# Patient Record
Sex: Male | Born: 1945 | Race: White | Hispanic: No | Marital: Married | Smoking: Former smoker
Health system: Southern US, Community
[De-identification: ages and names within clinical notes are randomized; demographics above are authoritative.]

## PROBLEM LIST (undated history)

## (undated) DIAGNOSIS — R7303 Prediabetes: Secondary | ICD-10-CM

## (undated) DIAGNOSIS — M1990 Unspecified osteoarthritis, unspecified site: Secondary | ICD-10-CM

## (undated) DIAGNOSIS — K219 Gastro-esophageal reflux disease without esophagitis: Secondary | ICD-10-CM

## (undated) HISTORY — PX: JOINT REPLACEMENT: SHX530

## (undated) HISTORY — DX: Gastro-esophageal reflux disease without esophagitis: K21.9

---

## 2006-05-29 ENCOUNTER — Ambulatory Visit: Payer: Self-pay | Admitting: Family Medicine

## 2008-01-22 ENCOUNTER — Ambulatory Visit: Payer: Self-pay | Admitting: Gastroenterology

## 2008-10-19 ENCOUNTER — Ambulatory Visit: Payer: Self-pay | Admitting: Neurology

## 2009-09-19 ENCOUNTER — Inpatient Hospital Stay: Payer: Self-pay | Admitting: *Deleted

## 2012-01-17 ENCOUNTER — Other Ambulatory Visit: Payer: Self-pay | Admitting: *Deleted

## 2012-12-30 ENCOUNTER — Ambulatory Visit: Payer: Self-pay | Admitting: Orthopedic Surgery

## 2012-12-30 DIAGNOSIS — I10 Essential (primary) hypertension: Secondary | ICD-10-CM

## 2012-12-30 LAB — BASIC METABOLIC PANEL
Anion Gap: 2 — ABNORMAL LOW (ref 7–16)
Calcium, Total: 9.2 mg/dL (ref 8.5–10.1)
Chloride: 105 mmol/L (ref 98–107)
EGFR (African American): >60
Glucose: 113 mg/dL — ABNORMAL HIGH (ref 65–99)
Osmolality: 278 (ref 275–301)
Potassium: 4.2 mmol/L (ref 3.5–5.1)
Sodium: 137 mmol/L (ref 136–145)

## 2012-12-30 LAB — CBC
MCH: 31.5 pg (ref 26.0–34.0)
RDW: 13.6 % (ref 11.5–14.5)
WBC: 7.4 10*3/uL (ref 3.8–10.6)

## 2012-12-30 LAB — SEDIMENTATION RATE: Erythrocyte Sed Rate: 17 mm/hr (ref 0–20)

## 2012-12-30 LAB — APTT: Activated PTT: 31.1 secs (ref 23.6–35.9)

## 2012-12-30 LAB — MRSA PCR SCREENING

## 2013-01-05 ENCOUNTER — Inpatient Hospital Stay: Payer: Self-pay | Admitting: Orthopedic Surgery

## 2013-01-06 LAB — BASIC METABOLIC PANEL
Anion Gap: 4 — ABNORMAL LOW (ref 7–16)
Calcium, Total: 8.2 mg/dL — ABNORMAL LOW (ref 8.5–10.1)
Chloride: 99 mmol/L (ref 98–107)
Co2: 30 mmol/L (ref 21–32)
Creatinine: 0.7 mg/dL (ref 0.60–1.30)
EGFR (African American): >60
EGFR (Non-African Amer.): >60
Glucose: 123 mg/dL — ABNORMAL HIGH (ref 65–99)
Potassium: 3.8 mmol/L (ref 3.5–5.1)
Sodium: 133 mmol/L — ABNORMAL LOW (ref 136–145)

## 2013-01-06 LAB — HEMOGLOBIN: HGB: 12.9 g/dL — ABNORMAL LOW (ref 13.0–18.0)

## 2013-01-07 LAB — PATHOLOGY REPORT

## 2013-05-26 ENCOUNTER — Other Ambulatory Visit: Payer: Self-pay | Admitting: Medical Oncology

## 2013-11-08 ENCOUNTER — Ambulatory Visit: Payer: Self-pay | Admitting: Gastroenterology

## 2014-03-16 IMAGING — XA DG C-ARM 1-60 MIN
1 series · 1 of 1 positions shown · non-contrast
Comparison: none

REASON FOR EXAM: Fracture reduction/fixation
COMMENTS:

[Series 6001: 1 · 1 of 1 slices shown]
[im 1/1]
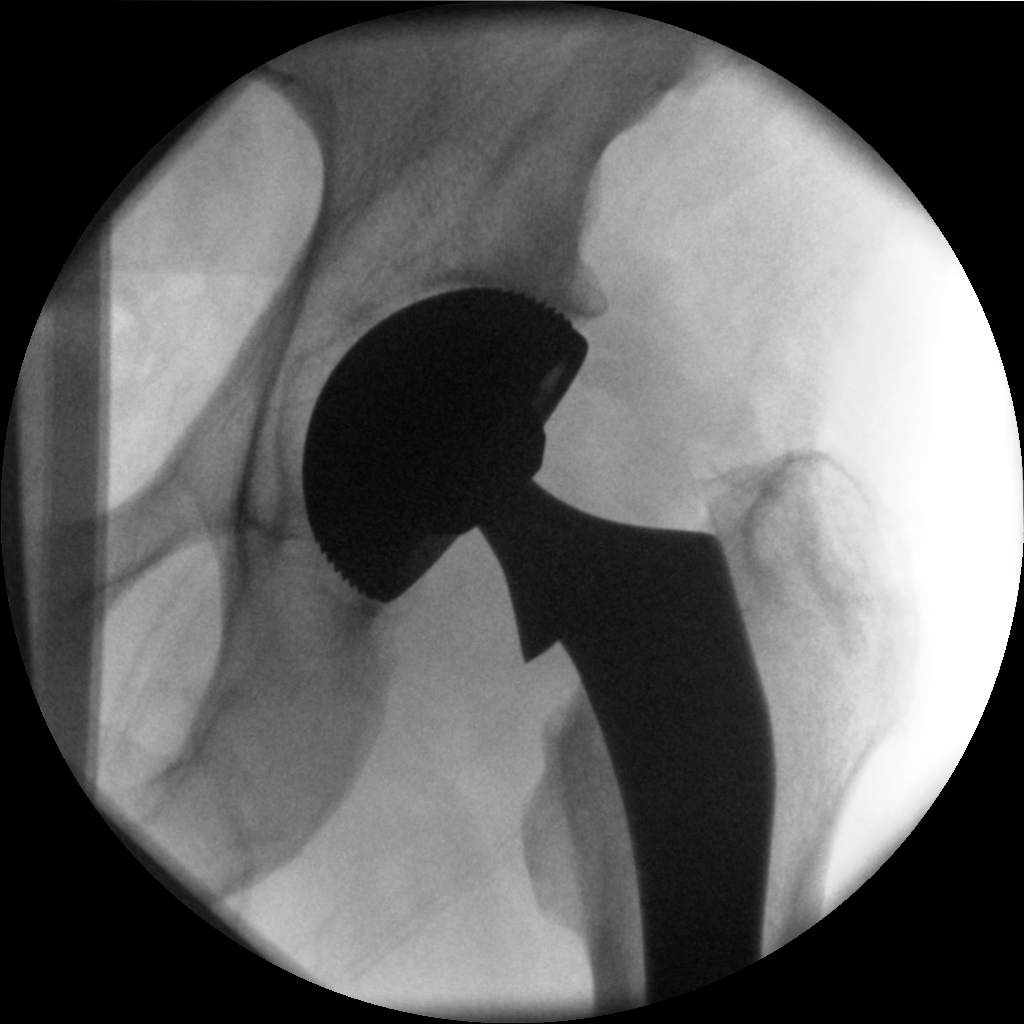

[1 of 1 positions shown; findings below may reference images not displayed]

PROCEDURE:     DXR - DXR C-ARM WITH 2 VIEWS LT HIP  - January 05, 2013 [DATE]

RESULT:     A single fluoroscopic spot film from the operating room reveals
the presence of a prosthetic hip joint presumably on the left. Radiographic
positioning of the visualized portions of the prosthetic components is good.
IMPRESSION: A prosthetic left hip joint is present. For further
interpretation is deferred to Dr. Jim.

[REDACTED]

## 2014-09-08 ENCOUNTER — Ambulatory Visit: Payer: Self-pay | Admitting: Orthopedic Surgery

## 2014-09-16 ENCOUNTER — Inpatient Hospital Stay: Payer: Self-pay | Admitting: Orthopedic Surgery

## 2014-11-18 NOTE — Op Note (Signed)
PATIENT NAME:  Travis CoyOVERBY, Oreste MR#:  [id number] DATE OF BIRTH:  22-Nov-1945  DATE OF PROCEDURE:  01/05/2013  PREOPERATIVE DIAGNOSIS: Left hip osteoarthritis.   POSTOPERATIVE DIAGNOSIS: Left hip osteoarthritis.   PROCEDURE: Left total hip replacement, anterior approach.   SURGEON: Kennedy BuckerMichael Kimberli Winne, M.D.   ASSISTANT:  Cranston Neighborhris Gaines, PA-C  ANESTHESIA: Spinal.   DESCRIPTION OF PROCEDURE: The patient was brought to the operating room and after adequate anesthesia was obtained, the patient was placed in a supine position with the right leg in a well leg holder and the left leg in the Medacta attachment. The C-arm was brought in and good visualization and x-ray of the left hip was obtained and printed. The hip was then prepped and draped using the usual method. After patient identification and timeout procedures were completed, an 8 cm incision was made over the anterior lateral hip overlying the tensor fascia lata muscle belly. The fascia was incised and the muscle belly retracted laterally with hemostasis being achieved with electrocautery. The deep fascia was incised and the rectus sheath opened. The circumflex vessels were identified, clamped and cauterized and the anterior capsule exposed, with anterior capsule tissue removed. Next, capsulotomy was carried out with based flap modified. Retractors were placed and this point and the femoral neck cut carried out. The head was removed and noted to have significant osteoarthritis. Modified Charnley was then placed and the labrum was excised along with the ligamentum teres. Sequential reaming was carried out up to 56 mm and a 56 mm trial fit well, medializing the cup to prevent lateral impingement of cup. The trial fit well and so the 56 mm dual mobility cup was impacted into position. At this point, the leg was externally rotated and a posterior capsular release of the pubofemoral and  ischiofemoral ligaments carried out. The leg was dropped into extension and  adduction, giving good exposure to the proximal femur. A box osteotome was used followed by a curette and the canal finder. Sequential broaching was carried out with trialing with a size 4. This was somewhat short and the canal did not appear  to be completely filled, so going up to a size 5 stem. Trials were carried out with a short femoral head. The leg lengths appeared equal on x-ray to the preop x-ray.  The #5 standard stem was then impacted down the canal with the collar.  The size S head, 28 mm, was placed inside the Versafit DM liner and applied to the size 5 stem, the head impacted and the hip reduced. The hip appeared stable was stable at 90 degrees rotation. Leg lengths appeared equal. X-rays taken at this time, printed off and compared to preop. The wound was irrigated. The capsule repaired using a single Ethibond suture, running quill for the deep fascia, 2-0 Vicryl subcutaneously and skin staples with 0.5% Sensorcaine infiltrated in the area of the incision to aid in postoperative analgesia.   ESTIMATED BLOOD LOSS: 150 mL.   COMPLICATIONS: None.   SPECIMENS: Femoral head.   FINDINGS: Extensive degenerative changes to the head and acetabulum.   IMPLANTS: Medacta Versafit cup DM 6 mm with a 56 mm DM liner. Size S 28 mm femoral head and a 5 standard Amis stem collared.  CONDITION TO RECOVERY ROOM: Stable    ____________________________ Leitha SchullerMichael J. Carmalita Wakefield, MD mjm:cc D: 01/05/2013 20:15:07 ET T: 01/05/2013 22:34:51 ET JOB#: [id number]  cc: Leitha SchullerMichael J. Robi Mitter, MD, <Dictator> Leitha SchullerMICHAEL J Julian Askin MD ELECTRONICALLY SIGNED 01/06/2013 7:57

## 2014-11-18 NOTE — Discharge Summary (Signed)
PATIENT NAME:  Travis Hodges, Bilbo MR#:  [id number] DATE OF BIRTH:  Mar 01, 1946  DATE OF ADMISSION:  01/05/2013 DATE OF DISCHARGE:  01/08/2013   ADMITTING DIAGNOSIS: Left hip osteoarthritis.   DISCHARGE DIAGNOSIS: Left hip osteoarthritis.   OPERATION: On 01/05/2013, he had a left total hip arthroplasty, anterior approach.   SURGEON: Leitha SchullerMichael J. Menz, MD  ANESTHESIA: Spinal.   ESTIMATED BLOOD LOSS: 150 mL.   DRAINS: None.  IMPLANTS: Medacta 56 DM cup and liner, S 28 mm head, AMIS 5 collared stem.   COMPLICATIONS: None.   HISTORY: Travis Hodges is a 69 year old male who failed conservative treatments for left hip osteoarthritis. He consented to surgery.   PHYSICAL EXAMINATION:  LUNGS: Clear.  HEART: Regular rate and rhythm.  HEENT: Normal.  MUSCULOSKELETAL: Has 0 degrees of internal rotation. Has 20 degrees of external rotation with severe pain. He has no clubbing or cyanosis or edema. He had strong DP and PT pulses. Slight flexion contracture.   HOSPITAL COURSE: After initial admission on 01/05/2013, he underwent a left total hip arthroplasty on that same day. He had good pain control afterwards and was brought to the orthopedic floor from the PACU. He had an initial hemoglobin of 12.9 and platelets 186. Physical therapy was begun on that day, but he did have some nausea with therapy. On postoperative day two, 01/06/2013, he continued to progress with therapy, although was having some pain. On postoperative day three, 01/08/2013, he continued to progress with physical therapy and was stable and ready to go home.   CONDITION AT DISCHARGE: Stable.   DISPOSITION: The patient was sent home.   DISCHARGE INSTRUCTIONS:  1. The patient will [facility] in 2 weeks for staple removal.  2. He will do home health physical therapy and be weight-bearing as tolerated on the left lower extremity.  3. TED hose knee-high bilaterally.  4. Diet is regular.  5. Aquacel dressing  was placed today and does not need to be changed until he comes back to clinic.   DISCHARGE MEDICATIONS:  1. Trazodone 100 mg half-tablet p.o. q.h.s.   2. Norco 325/5 one tab p.o. q.6 hours p.r.n. pain.  3. Prevacid 30 mg delayed release 1 cap p.o. q.a.m.  4. Acetaminophen 500 mg 1 tab p.o. q.4 hours p.r.n. pain or temperature greater than 100.4.  5. Oxycodone 5 mg 1 to 2 tabs p.o. q.4-6 hours p.r.n. severe pain.  6. Tramadol 50 mg 1 tab p.o. q.6 hours p.r.n. pain.  7. Magnesium hydroxide 8% oral suspension 30 mL 2 times daily as needed for constipation. 8. Aluminum hydroxide/magnesium hydroxide/simethicone 400/400/40 per 5 mL oral suspension 30 mL p.o. q.6 p.r.n. indigestion or heartburn.  9. Rivaroxaban 10 mg 1 tab p.o. q.a.m. for 33 days.  10. Ondansetron 4 mg 1 tab p.o. q.4 hours p.r.n. nausea.  11. Diazepam 5 mg 1 tab p.o. q.8 hours p.r.n. anxiety.  12. Zolpidem 5 mg 1 tab p.o. q.h.s. p.r.n. insomnia.  13. Bisacodyl 10 mg rectal suppository 1 suppository rectal once daily p.r.n. constipation.  14. Docusate/senna 50/8.6 one tab p.o. b.i.d. p.r.n. constipation.   ____________________________ Tahiri Shareef M. Haskel KhanBerndt, NP amb:OSi D: 01/08/2013 08:53:26 ET T: 01/08/2013 09:24:49 ET JOB#: [id number]  cc: Taner Rzepka M. Haskel KhanBerndt, NP, <Dictator> Burt EkAPRIL M Obbie Lewallen FNP ELECTRONICALLY SIGNED 02/02/2013 13:03

## 2014-11-21 LAB — SURGICAL PATHOLOGY

## 2014-11-27 NOTE — Op Note (Signed)
PATIENT NAME:  Travis Hodges, Travis Hodges MR#:  [id number] DATE OF BIRTH:  1945/08/12  DATE OF PROCEDURE:  09/16/2014  PREOPERATIVE DIAGNOSIS: Right hip osteoarthritis.   POSTOPERATIVE DIAGNOSIS: Right hip osteoarthritis.  PROCEDURE: Right hip right total hip replacement.   ANESTHESIA: Spinal.   SURGEON: Kennedy BuckerMichael Greysyn Vanderberg, MD   DESCRIPTION OF PROCEDURE: The patient was brought to the operating room and after adequate anesthesia was obtained, the patient was placed on the operative table with the right foot in the Medacta attachment, left foot on a well-padded table. The C-arm was brought in and good visualization of the hip with preoperative template obtained. The hip was then prepped and draped in the usual sterile manner with appropriate patient identification and timeout procedure completed. A direct anterior approach was made over the greater trochanter and TFL muscle. The incision was carried down through the skin and subcutaneous tissue. The tensor fascia was incised and retracted laterally. Deep fascia incised and the lateral femoral vessels ligated. The anterior capsule was then exposed and a capsulotomy performed. The neck was then exposed and the femoral head and neck cut carried out with the head removed showing extensive degenerative change. Reaming was carried out to 58 mm, which at which point there was good bleeding bone in the acetabulum. A 58 mm Versafitcup fit well after initially using a trial. The leg was externally rotated and minimal release of the ischiofemoral ligament was carried out. The leg was dropped into extension. Sequential broaching was carried out to a size 5 with an M trial. This gave appropriate leg length and offset. The size 5 stem was impacted with the M 28 head combined with a dual mobility liner assembled on the back table. These were all inserted. It was reduced after checking that that the acetabulum was free of any debris. The hip was stable to 90 degrees external rotation  test. The wound was irrigated with Betadine solution. Prior to wound closure, infiltration of 30 mL of 0.25% Sensorcaine with epinephrine. The deep fascia was repaired using a heavy quill, 2-0 quill subcutaneously, and skin staples. Xeroform, 4 x 4's, ABD, and tape applied. The patient was sent to recovery room stable condition.   ESTIMATED BLOOD LOSS: 300 mL.   COMPLICATIONS: None.   SPECIMEN: Removed femoral head.   IMPLANTS: Medacta Versafitcup DM 58 mm with liner, M 28 mm head, and a size 5 standard Amis stem.    ____________________________ Leitha SchullerMichael J. Myrtie Leuthold, MD mjm:bm Hodges: 09/16/2014 19:11:20 ET T: 09/17/2014 06:17:53 ET JOB#: [id number]  cc: Leitha SchullerMichael J. Tishara Pizano, MD, <Dictator> Leitha SchullerMICHAEL J Brent Noto MD ELECTRONICALLY SIGNED 09/17/2014 7:26

## 2014-11-27 NOTE — Discharge Summary (Signed)
PATIENT NAME:  Travis Hodges, Jevaun D MR#:  [id number] DATE OF BIRTH:  07-05-1946  DATE OF ADMISSION:  09/16/2014 DATE OF DISCHARGE: 09/19/2014   ADMITTING DIAGNOSIS: Right hip osteoarthritis.   DISCHARGE DIAGNOSIS: Right hip osteoarthritis.  OPERATION: On 09/16/2014, the patient had a right hip replacement using anterior hip.   ANESTHESIA: Spinal.   ESTIMATED BLOOD LOSS: 300 mL.   COMPLICATIONS: None.   IMPLANTS USED: A Medacta Versafitcup diastolic murmur 58 mm with liner, M 28 mm head, size 5 standard AMIS stem. The patient was stabilized, brought to the recovery room and then brought down to the orthopedic floor.   HISTORY: The patient is a 69 year old male who presented for persistent pain involving the right hip and leg. The patient has been refractory to conservative treatment and has tried physical therapy as well as cortisone injections with no relief.   PHYSICAL EXAMINATION:  GENERAL: Well-developed, well-nourished male with an abnormal gait and a moderate limp on the right side. The patient has range of motion of the right hip with 60% of motion including severe pain. The patient has full extension to 120 degrees flexion with pain. The patient has internal and external rotation but is limited.   HOSPITAL COURSE: After initial admission on 09/16/2014, the patient was brought to the orthopedic floor. On postoperative day 1, the patient had a hemoglobin of 13.1, which remained stable there. The patient did physical therapy initially ambulating bed to chair and then progressively ambulating around the nurse's station, including doing 4 stairs. The patient did have bowel movement and was ready to go home with home health physical therapy on 09/19/2014.    CONDITION AT DISCHARGE: Stable.   DISPOSITION: The patient was sent home with home health physical therapy. The patient will follow up [facility] in 2 weeks for staple removal. The patient will do weight bearing as tolerated on the  affected leg. The patient will use 1-2 pillows under the knee and foot. The patient will use anterior hip precautions. The patient will use knee-high TED hose on both legs, removed at nighttime. The patient will elevate his heels off the bed and use incentive spirometer as well as be encouraged to do cough and deep breathing. The patient will use a regular diet. The patient will use an ice pack on the leg if needed. The patient will keep his dressing clean and dry, try not to get it wet or dirty. The patient will do dressing changes as needed with physical therapy. The patient will call the clinic if there is any bright red bleeding, calf pain, bowel or bladder difficulty, or any fever greater than 101.5. The patient will do home health physical therapy working on gait training and strengthening.    DISCHARGE MEDICATIONS: No change in home medications and just add oxycodone 5 mg 1 tablet every 4 hours as needed for moderate to severe pain, Tylenol 500 mg 1 tablet every 4 hours as needed for less severe pain or fever, Lovenox 40 mg subcutaneous once a day for 14 days and then discontinue and start an aspirin 81 mg once a day and Flexeril 10 mg 1 tablet q. 8 hours as needed for muscle spasms.    ___________________________ Shela CommonsJ. Dedra Skeensodd Collene Massimino, [place]PA jtm:mc D: 09/19/2014 06:24:00 ET T: 09/19/2014 08:08:18 ET JOB#: [id number]  cc: J. Dedra Skeensodd Gorje Iyer, [place]PA, <Dictator> Jim DesanctisJ Lennis Korb [facility] PA ELECTRONICALLY SIGNED 09/20/2014 13:39

## 2015-11-21 ENCOUNTER — Emergency Department
Admission: EM | Admit: 2015-11-21 | Discharge: 2015-11-22 | Disposition: A | Discharge status: Home / Self Care | Payer: Managed Care, Other (non HMO) | Attending: Emergency Medicine | Admitting: Emergency Medicine

## 2015-11-21 ENCOUNTER — Emergency Department: Payer: Managed Care, Other (non HMO)

## 2015-11-21 ENCOUNTER — Encounter: Payer: Self-pay | Admitting: *Deleted

## 2015-11-21 DIAGNOSIS — R112 Nausea with vomiting, unspecified: Secondary | ICD-10-CM | POA: Insufficient documentation

## 2015-11-21 DIAGNOSIS — R079 Chest pain, unspecified: Secondary | ICD-10-CM | POA: Diagnosis present

## 2015-11-21 DIAGNOSIS — A0811 Acute gastroenteropathy due to Norwalk agent: Secondary | ICD-10-CM

## 2015-11-21 DIAGNOSIS — R109 Unspecified abdominal pain: Secondary | ICD-10-CM | POA: Insufficient documentation

## 2015-11-21 DIAGNOSIS — R197 Diarrhea, unspecified: Secondary | ICD-10-CM | POA: Insufficient documentation

## 2015-11-21 LAB — BASIC METABOLIC PANEL
Anion gap: 9 (ref 5–15)
BUN: 27 mg/dL — AB (ref 6–20)
CHLORIDE: 104 mmol/L (ref 101–111)
CO2: 24 mmol/L (ref 22–32)
CREATININE: 0.91 mg/dL (ref 0.61–1.24)
Calcium: 9.2 mg/dL (ref 8.9–10.3)
GFR calc Af Amer: >60 mL/min (ref >60)
GFR calc non Af Amer: >60 mL/min (ref >60)
GLUCOSE: 128 mg/dL — AB (ref 65–99)
POTASSIUM: 4.4 mmol/L (ref 3.5–5.1)
SODIUM: 137 mmol/L (ref 135–145)

## 2015-11-21 LAB — CBC
HEMATOCRIT: 46.5 % (ref 40.0–52.0)
Hemoglobin: 16.3 g/dL (ref 13.0–18.0)
MCH: 30.9 pg (ref 26.0–34.0)
MCHC: 35.1 g/dL (ref 32.0–36.0)
MCV: 88 fL (ref 80.0–100.0)
Platelets: 205 10*3/uL (ref 150–440)
RBC: 5.29 MIL/uL (ref 4.40–5.90)
RDW: 13.6 % (ref 11.5–14.5)
WBC: 11.1 10*3/uL — AB (ref 3.8–10.6)

## 2015-11-21 LAB — TROPONIN I
Troponin I: <0.03 ng/mL (ref <0.031)
Troponin I: <0.03 ng/mL (ref <0.031)

## 2015-11-21 MED ORDER — DICYCLOMINE HCL 20 MG PO TABS: 20.0000 mg | ORAL_TABLET | Freq: Three times a day (TID) | ORAL | Status: DC | PRN

## 2015-11-21 MED ORDER — ONDANSETRON HCL 4 MG/2ML IJ SOLN
4.0000 mg | Freq: Once | INTRAMUSCULAR | Status: AC
  Administered 2015-11-21: 4 mg via INTRAVENOUS
  Filled 2015-11-21: qty 2

## 2015-11-21 MED ORDER — LOPERAMIDE HCL 2 MG PO TABS: 2.0000 mg | ORAL_TABLET | Freq: Four times a day (QID) | ORAL | Status: DC | PRN

## 2015-11-21 MED ORDER — ONDANSETRON HCL 4 MG PO TABS: 4.0000 mg | ORAL_TABLET | Freq: Every day | ORAL | Status: DC | PRN

## 2015-11-21 MED ORDER — DIATRIZOATE MEGLUMINE & SODIUM 66-10 % PO SOLN
15.0000 mL | Freq: Once | ORAL | Status: AC
  Administered 2015-11-21: 15 mL via ORAL

## 2015-11-21 MED ORDER — MORPHINE SULFATE (PF) 4 MG/ML IV SOLN
4.0000 mg | Freq: Once | INTRAVENOUS | Status: AC
  Administered 2015-11-21: 4 mg via INTRAVENOUS
  Filled 2015-11-21: qty 1

## 2015-11-21 MED ORDER — SODIUM CHLORIDE 0.9 % IV SOLN
Freq: Once | INTRAVENOUS | Status: AC
  Administered 2015-11-21: 22:00:00 via INTRAVENOUS

## 2015-11-21 MED ORDER — IOPAMIDOL (ISOVUE-300) INJECTION 61%
100.0000 mL | Freq: Once | INTRAVENOUS | Status: AC | PRN
Start: 2015-11-21 — End: 2015-11-21
  Administered 2015-11-21: 100 mL via INTRAVENOUS

## 2015-11-21 NOTE — ED Notes (Signed)
Patient transported to CT 

## 2015-11-21 NOTE — ED Notes (Signed)
Pt to ED via POV with left sided chest pain, SOB, lightheadedness and dizziness that began about 1-2 hours ago while at work. Pt was sitting at desk when s/s began. Pt clenching chest upon arrival, states pain 10/10 in chest and abd.

## 2015-11-21 NOTE — ED Provider Notes (Signed)
[facility] Emergency Department Provider Note     Time seen: ----------------------------------------- 9:55 PM on 11/21/2015 -----------------------------------------    I have reviewed the triage vital signs and the nursing notes.   HISTORY  Chief Complaint Chest Pain and Shortness of Breath    HPI Travis Hodges is a 70 y.o. male who presents ER for left-sided chest pain, shortness of breath and lightheadedness that began about 2 hours ago while at work. Patient states he's had significant nausea vomiting and diarrhea and he is passing blood in his stool. Patient describes pain in the upper part of his abdomen as severe and sharp. He is still feeling nauseated.   History reviewed. No pertinent past medical history.  There are no active problems to display for this patient.   Past Surgical History  Procedure Laterality Date  . Joint replacement      Allergies Review of patient's allergies indicates no known allergies.  Social History Social History  Substance Use Topics  . Smoking status: Never Smoker   . Smokeless tobacco: None  . Alcohol Use: No    Review of Systems Constitutional: Negative for fever. Eyes: Negative for visual changes. ENT: Negative for sore throat. Cardiovascular: Positive for chest pain Respiratory: Positive for difficulty breathing Gastrointestinal: Positive for abdominal pain, vomiting and diarrhea with some blood in the stool Genitourinary: Negative for dysuria. Musculoskeletal: Negative for back pain. Skin: Negative for rash. Neurological: Positive for weakness  10-point ROS otherwise negative.  ____________________________________________   PHYSICAL EXAM:  VITAL SIGNS: ED Triage Vitals  Enc Vitals Group     BP 11/21/15 1951 167/85 mmHg     Pulse Rate 11/21/15 1951 82     Resp 11/21/15 1951 18     Temp 11/21/15 1951 98.3 F (36.8 C)     Temp Source 11/21/15 1951 Oral     SpO2 11/21/15 1951 99  %     Weight 11/21/15 1951 210 lb (95.255 kg)     Height 11/21/15 1951 5\' 10"  (1.778 m)     Head Cir --      Peak Flow --      Pain Score 11/21/15 1952 10     Pain Loc --      Pain Edu? --      Excl. in GC? --    Constitutional: Alert and oriented. Mild distress Eyes: Conjunctivae are normal. PERRL. Normal extraocular movements. ENT   Head: Normocephalic and atraumatic.   Nose: No congestion/rhinnorhea.   Mouth/Throat: Mucous membranes are moist.   Neck: No stridor. Cardiovascular: Normal rate, regular rhythm. No murmurs, rubs, or gallops. Respiratory: Normal respiratory effort without tachypnea nor retractions. Breath sounds are clear and equal bilaterally. No wheezes/rales/rhonchi. Gastrointestinal: Moderate to severe upper abdominal tenderness, distended abdomen, normal bowel sounds. Musculoskeletal: Nontender with normal range of motion in all extremities. No lower extremity tenderness nor edema. Neurologic:  Normal speech and language. No gross focal neurologic deficits are appreciated.  Skin:  Skin is warm, dry and intact. No rash noted. Psychiatric: Mood and affect are normal. Speech and behavior are normal.  ____________________________________________  EKG: Interpreted by me. Sinus rhythm with sinus arrhythmia, rate is 81 bpm, normal PR interval, normal QRS, normal QT intervals. Normal axis.  ____________________________________________  ED COURSE:  Pertinent labs & imaging results that were available during my care of the patient were reviewed by me and considered in my medical decision making (see chart for details). Patient's primary complaint is abdominal pain with vomiting and diarrhea.  I will check basic labs, give IV fluids and antiemetics. Patient may also need imaging of the upper abdomen. ____________________________________________    LABS (pertinent positives/negatives)  Labs Reviewed  BASIC METABOLIC PANEL - Abnormal; Notable for the  following:    Glucose, Bld 128 (*)    BUN 27 (*)    All other components within normal limits  CBC - Abnormal; Notable for the following:    WBC 11.1 (*)    All other components within normal limits  TROPONIN I    RADIOLOGY Chest x-ray IMPRESSION: Peribronchial thickening noted. Lungs otherwise grossly clear. CT of the abdomen and pelvis with contrast: IMPRESSION: 1. Numerous distended and fluid-filled small bowel loops. No transition point is seen, suggesting enteritis rather than partial obstruction 2. Cholelithiasis without cholecystitis. 3. Colonic diverticulosis. ____________________________________________  FINAL ASSESSMENT AND PLAN  Abdominal pain, vomiting and diarrhea, chest pain  Plan: Patient with labs and imaging as dictated above. Patient clinically with gastroenteritis, no bowel obstruction seen on CT. He'll be discharged with antiemetics and antidiarrheal agents. He is stable for outpatient follow-up with his doctor, troponin was negative 2.   Emily Filbert, MD   Emily Filbert, MD 11/21/15 [phone]

## 2015-11-21 NOTE — Discharge Instructions (Signed)
Nonspecific Chest Pain  Chest pain can be caused by many different conditions. There is always a chance that your pain could be related to something serious, such as a heart attack or a blood clot in your lungs. Chest pain can also be caused by conditions that are not life-threatening. If you have chest pain, it is very important to follow up with your health care provider. CAUSES  Chest pain can be caused by:  Heartburn.  Pneumonia or bronchitis.  Anxiety or stress.  Inflammation around your heart (pericarditis) or lung (pleuritis or pleurisy).  A blood clot in your lung.  A collapsed lung (pneumothorax). It can develop suddenly on its own (spontaneous pneumothorax) or from trauma to the chest.  Shingles infection (varicella-zoster virus).  Heart attack.  Damage to the bones, muscles, and cartilage that make up your chest wall. This can include:  Bruised bones due to injury.  Strained muscles or cartilage due to frequent or repeated coughing or overwork.  Fracture to one or more ribs.  Sore cartilage due to inflammation (costochondritis). RISK FACTORS  Risk factors for chest pain may include:  Activities that increase your risk for trauma or injury to your chest.  Respiratory infections or conditions that cause frequent coughing.  Medical conditions or overeating that can cause heartburn.  Heart disease or family history of heart disease.  Conditions or health behaviors that increase your risk of developing a blood clot.  Having had chicken pox (varicella zoster). SIGNS AND SYMPTOMS Chest pain can feel like:  Burning or tingling on the surface of your chest or deep in your chest.  Crushing, pressure, aching, or squeezing pain.  Dull or sharp pain that is worse when you move, cough, or take a deep breath.  Pain that is also felt in your back, neck, shoulder, or arm, or pain that spreads to any of these areas. Your chest pain may come and go, or it may stay  constant. DIAGNOSIS Lab tests or other studies may be needed to find the cause of your pain. Your health care provider may have you take a test called an ambulatory ECG (electrocardiogram). An ECG records your heartbeat patterns at the time the test is performed. You may also have other tests, such as:  Transthoracic echocardiogram (TTE). During echocardiography, sound waves are used to create a picture of all of the heart structures and to look at how blood flows through your heart.  Transesophageal echocardiogram (TEE).This is a more advanced imaging test that obtains images from inside your body. It allows your health care provider to see your heart in finer detail.  Cardiac monitoring. This allows your health care provider to monitor your heart rate and rhythm in real time.  Holter monitor. This is a portable device that records your heartbeat and can help to diagnose abnormal heartbeats. It allows your health care provider to track your heart activity for several days, if needed.  Stress tests. These can be done through exercise or by taking medicine that makes your heart beat more quickly.  Blood tests.  Imaging tests. TREATMENT  Your treatment depends on what is causing your chest pain. Treatment may include:  Medicines. These may include:  Acid blockers for heartburn.  Anti-inflammatory medicine.  Pain medicine for inflammatory conditions.  Antibiotic medicine, if an infection is present.  Medicines to dissolve blood clots.  Medicines to treat coronary artery disease.  Supportive care for conditions that do not require medicines. This may include:  Resting.  Applying heat  or cold packs to injured areas.  Limiting activities until pain decreases. HOME CARE INSTRUCTIONS  If you were prescribed an antibiotic medicine, finish it all even if you start to feel better.  Avoid any activities that bring on chest pain.  Do not use any tobacco products, including  cigarettes, chewing tobacco, or electronic cigarettes. If you need help quitting, ask your health care provider.  Do not drink alcohol.  Take medicines only as directed by your health care provider.  Keep all follow-up visits as directed by your health care provider. This is important. This includes any further testing if your chest pain does not go away.  If heartburn is the cause for your chest pain, you may be told to keep your head raised (elevated) while sleeping. This reduces the chance that acid will go from your stomach into your esophagus.  Make lifestyle changes as directed by your health care provider. These may include:  Getting regular exercise. Ask your health care provider to suggest some activities that are safe for you.  Eating a heart-healthy diet. A registered dietitian can help you to learn healthy eating options.  Maintaining a healthy weight.  Managing diabetes, if necessary.  Reducing stress. SEEK MEDICAL CARE IF:  Your chest pain does not go away after treatment.  You have a rash with blisters on your chest.  You have a fever. SEEK IMMEDIATE MEDICAL CARE IF:   Your chest pain is worse.  You have an increasing cough, or you cough up blood.  You have severe abdominal pain.  You have severe weakness.  You faint.  You have chills.  You have sudden, unexplained chest discomfort.  You have sudden, unexplained discomfort in your arms, back, neck, or jaw.  You have shortness of breath at any time.  You suddenly start to sweat, or your skin gets clammy.  You feel nauseous or you vomit.  You suddenly feel light-headed or dizzy.  Your heart begins to beat quickly, or it feels like it is skipping beats. These symptoms may represent a serious problem that is an emergency. Do not wait to see if the symptoms will go away. Get medical help right away. Call your local emergency services (911 in the [place] Do not drive yourself to the hospital.   This  information is not intended to replace advice given to you by your health care provider. Make sure you discuss any questions you have with your health care provider.   Document Released: 04/24/2005 Document Revised: 08/05/2014 Document Reviewed: 02/18/2014 Elsevier Interactive Patient Education 2016 [employer] Inc[employer]  Viral Gastroenteritis Viral gastroenteritis is also known as stomach flu. This condition affects the stomach and intestinal tract. It can cause sudden diarrhea and vomiting. The illness typically lasts 3 to 8 days. Most people develop an immune response that eventually gets rid of the virus. While this natural response develops, the virus can make you quite ill. CAUSES  Many different viruses can cause gastroenteritis, such as rotavirus or noroviruses. You can catch one of these viruses by consuming contaminated food or water. You may also catch a virus by sharing utensils or other personal items with an infected person or by touching a contaminated surface. SYMPTOMS  The most common symptoms are diarrhea and vomiting. These problems can cause a severe loss of body fluids (dehydration) and a body salt (electrolyte) imbalance. Other symptoms may include:  Fever.  Headache.  Fatigue.  Abdominal pain. DIAGNOSIS  Your caregiver can usually diagnose viral gastroenteritis based on your  symptoms and a physical exam. A stool sample may also be taken to test for the presence of viruses or other infections. TREATMENT  This illness typically goes away on its own. Treatments are aimed at rehydration. The most serious cases of viral gastroenteritis involve vomiting so severely that you are not able to keep fluids down. In these cases, fluids must be given through an intravenous line (IV). HOME CARE INSTRUCTIONS   Drink enough fluids to keep your urine clear or pale yellow. Drink small amounts of fluids frequently and increase the amounts as tolerated.  Ask your caregiver for specific  rehydration instructions.  Avoid:  Foods high in sugar.  Alcohol.  Carbonated drinks.  Tobacco.  Juice.  Caffeine drinks.  Extremely hot or cold fluids.  Fatty, greasy foods.  Too much intake of anything at one time.  Dairy products until 24 to 48 hours after diarrhea stops.  You may consume probiotics. Probiotics are active cultures of beneficial bacteria. They may lessen the amount and number of diarrheal stools in adults. Probiotics can be found in yogurt with active cultures and in supplements.  Wash your hands well to avoid spreading the virus.  Only take over-the-counter or prescription medicines for pain, discomfort, or fever as directed by your caregiver. Do not give aspirin to children. Antidiarrheal medicines are not recommended.  Ask your caregiver if you should continue to take your regular prescribed and over-the-counter medicines.  Keep all follow-up appointments as directed by your caregiver. SEEK IMMEDIATE MEDICAL CARE IF:   You are unable to keep fluids down.  You do not urinate at least once every 6 to 8 hours.  You develop shortness of breath.  You notice blood in your stool or vomit. This may look like coffee grounds.  You have abdominal pain that increases or is concentrated in one small area (localized).  You have persistent vomiting or diarrhea.  You have a fever.  The patient is a child younger than 3 months, and he or she has a fever.  The patient is a child older than 3 months, and he or she has a fever and persistent symptoms.  The patient is a child older than 3 months, and he or she has a fever and symptoms suddenly get worse.  The patient is a baby, and he or she has no tears when crying. MAKE SURE YOU:   Understand these instructions.  Will watch your condition.  Will get help right away if you are not doing well or get worse.   This information is not intended to replace advice given to you by your health care provider.  Make sure you discuss any questions you have with your health care provider.   Document Released: 07/15/2005 Document Revised: 10/07/2011 Document Reviewed: 05/01/2011 Elsevier Interactive Patient Education [employer]

## 2015-11-21 NOTE — ED Notes (Signed)
SpO2 88% on RA. Patient placed on 3L [place]. Tolerating well SpO2 95%.

## 2015-11-22 NOTE — ED Notes (Signed)
Discharge instructions reviewed with patient. Patient verbalized understanding. Patient taken to lobby via wheelchair and helped in to vehicle 

## 2016-11-28 ENCOUNTER — Ambulatory Visit: Payer: Self-pay | Admitting: Physician Assistant

## 2016-11-28 ENCOUNTER — Encounter: Payer: Self-pay | Admitting: Physician Assistant

## 2016-11-28 VITALS — BP 120/70 | HR 86 | Temp 98.5°F | Ht 72.0 in | Wt 217.0 lb

## 2016-11-28 DIAGNOSIS — Z0000 Encounter for general adult medical examination without abnormal findings: Secondary | ICD-10-CM

## 2016-11-28 DIAGNOSIS — R7309 Other abnormal glucose: Secondary | ICD-10-CM

## 2016-11-28 NOTE — Progress Notes (Signed)
S: pt here for wellness physical for insurance purposes, no complaints, states he is worried about becoming diabetic and his last glucose test was elevated, doesn't see his pcp until July;  ros neg. PMH: gerd   Social: nonsmoker, no etoh Fam: mother died of stomach cancer at 6292; father had dm  O: vitals wnl, nad, ENT wnl, neck supple no lymph, lungs c t a, cv rrr, abd soft nontender bs normal all 4 quads  A: wellness physical  P:  f/u with pcp for yearly physical in July, will order A1C today

## 2016-11-29 LAB — HGB A1C W/O EAG: Hgb A1c MFr Bld: 5.8 % — ABNORMAL HIGH (ref 4.8–5.6)

## 2017-05-19 ENCOUNTER — Other Ambulatory Visit: Payer: Self-pay | Admitting: Surgery

## 2017-05-19 DIAGNOSIS — M75101 Unspecified rotator cuff tear or rupture of right shoulder, not specified as traumatic: Secondary | ICD-10-CM

## 2017-05-19 DIAGNOSIS — M7581 Other shoulder lesions, right shoulder: Secondary | ICD-10-CM

## 2017-05-28 ENCOUNTER — Ambulatory Visit
Admission: RE | Admit: 2017-05-28 | Discharge: 2017-05-28 | Disposition: A | Discharge status: Home / Self Care | Payer: Medicare Other | Source: Ambulatory Visit | Attending: Surgery | Admitting: Surgery

## 2017-05-28 DIAGNOSIS — M19011 Primary osteoarthritis, right shoulder: Secondary | ICD-10-CM | POA: Diagnosis not present

## 2017-05-28 DIAGNOSIS — M75101 Unspecified rotator cuff tear or rupture of right shoulder, not specified as traumatic: Secondary | ICD-10-CM

## 2017-05-28 DIAGNOSIS — M7581 Other shoulder lesions, right shoulder: Secondary | ICD-10-CM

## 2017-06-24 ENCOUNTER — Inpatient Hospital Stay: Admission: RE | Admit: 2017-06-24 | Payer: Self-pay | Source: Ambulatory Visit

## 2017-06-25 ENCOUNTER — Other Ambulatory Visit: Payer: Self-pay

## 2017-06-25 ENCOUNTER — Encounter: Payer: Self-pay | Admitting: *Deleted

## 2017-06-25 ENCOUNTER — Encounter
Admission: RE | Admit: 2017-06-25 | Discharge: 2017-06-25 | Disposition: A | Discharge status: Home / Self Care | Payer: Medicare Other | Source: Ambulatory Visit | Attending: Surgery | Admitting: Surgery

## 2017-06-25 NOTE — Patient Instructions (Signed)
Your procedure is scheduled on: 07-01-17 TUESDAY Report to Same Day Surgery 2nd floor medical mall [facility](Medical Mall Entrance-take elevator on left to 2nd floor.  Check in with surgery information desk.) To find out your arrival time please call [phone] between 1PM - 3PM on 06-30-17 MONDAY  Remember: Instructions that are not followed completely may result in serious medical risk, up to and including death, or upon the discretion of your surgeon and anesthesiologist your surgery may need to be rescheduled.    _x___ 1. Do not eat food after midnight the night before your procedure.  NO GUM CHEWING OR CANDY AFTER MIDNIGHT.   You may drink clear liquids up to 2 hours before you are scheduled to arrive at the hospital for your procedure.  Do not drink clear liquids within 2 hours of your scheduled arrival to the hospital.  Clear liquids include  --Water or Apple juice without pulp  --Clear carbohydrate beverage such as ClearFast or Gatorade  --Black Coffee or Clear Tea (No milk, no creamers, do not add anything to the coffee or Tea     __x__ 2. No Alcohol for 24 hours before or after surgery.   __x__3. No Smoking for 24 prior to surgery.   ____  4. Bring all medications with you on the day of surgery if instructed.    __x__ 5. Notify your doctor if there is any change in your medical condition     (cold, fever, infections).     Do not wear jewelry, make-up, hairpins, clips or nail polish.  Do not wear lotions, powders, or perfumes. You may wear deodorant.  Do not shave 48 hours prior to surgery. Men may shave face and neck.  Do not bring valuables to the hospital.    [facility] is not responsible for any belongings or valuables.               Contacts, dentures or bridgework may not be worn into surgery.  Leave your suitcase in the car. After surgery it may be brought to your room.  For patients admitted to the hospital, discharge time is determined by your treatment  team.   Patients discharged the day of surgery will not be allowed to drive home.  You will need someone to drive you home and stay with you the night of your procedure.    Please read over the following fact sheets that you were given:   [facility] Preparing [facility] and or MRSA Information   _x___ TAKE THE FOLLOWING MEDICATION THE MORNING OF SURGERY WITH A SMALL SIP OF WATER. These include:  1. PREVACID  2. TAKE AN EXTRA PREVACID Monday NIGHT BEFORE BED  3.  4.  5.  6.  ____Fleets enema or Magnesium Citrate as directed.   _x___ Use CHG Soap or sage wipes as directed on instruction sheet   ____ Use inhalers on the day of surgery and bring to hospital day of surgery  ____ Stop Metformin and Janumet 2 days prior to surgery.    ____ Take 1/2 of usual insulin dose the night before surgery and none on the morning surgery.   ____ Follow recommendations from Cardiologist, Pulmonologist or PCP regarding stopping Aspirin, Coumadin, Plavix ,Eliquis, Effient, or Pradaxa, and Pletal.  X____Stop Anti-inflammatories such as Advil, Aleve, IBUPROFEN, Motrin, Naproxen,  Naprosyn, Goodies powders or aspirin products NOW- OK to take Tylenol OR TRAMADOL (ULTRAM) IF NEEDED   ____ Stop supplements until after surgery.     ____ [employer]  C-Pap to the hospital.

## 2017-06-27 ENCOUNTER — Encounter
Admission: RE | Admit: 2017-06-27 | Discharge: 2017-06-27 | Disposition: A | Discharge status: Home / Self Care | Payer: Medicare Other | Source: Ambulatory Visit | Attending: Surgery | Admitting: Surgery

## 2017-06-27 DIAGNOSIS — Z01810 Encounter for preprocedural cardiovascular examination: Secondary | ICD-10-CM | POA: Insufficient documentation

## 2017-06-27 DIAGNOSIS — M75101 Unspecified rotator cuff tear or rupture of right shoulder, not specified as traumatic: Secondary | ICD-10-CM | POA: Diagnosis not present

## 2017-06-27 NOTE — Pre-Procedure Instructions (Signed)
Pt here for EKG, went over pre-op instructions and surgical consent, pt voiced understanding and asked appropriate questions, both signed.

## 2017-06-30 MED ORDER — CEFAZOLIN SODIUM-DEXTROSE 2-4 GM/100ML-% IV SOLN
2.0000 g | Freq: Once | INTRAVENOUS | Status: AC
  Administered 2017-07-01: 2 g via INTRAVENOUS

## 2017-07-01 ENCOUNTER — Ambulatory Visit: Payer: Medicare Other | Admitting: Anesthesiology

## 2017-07-01 ENCOUNTER — Other Ambulatory Visit: Payer: Self-pay

## 2017-07-01 ENCOUNTER — Encounter: Payer: Self-pay | Admitting: *Deleted

## 2017-07-01 ENCOUNTER — Ambulatory Visit
Admission: RE | Admit: 2017-07-01 | Discharge: 2017-07-01 | Disposition: A | Discharge status: Home / Self Care | Payer: Medicare Other | Source: Ambulatory Visit | Attending: Surgery | Admitting: Surgery

## 2017-07-01 ENCOUNTER — Encounter
Admission: RE | Disposition: A | Discharge status: Home / Self Care | Payer: Self-pay | Source: Ambulatory Visit | Attending: Surgery

## 2017-07-01 DIAGNOSIS — M1990 Unspecified osteoarthritis, unspecified site: Secondary | ICD-10-CM | POA: Insufficient documentation

## 2017-07-01 DIAGNOSIS — Z96643 Presence of artificial hip joint, bilateral: Secondary | ICD-10-CM | POA: Insufficient documentation

## 2017-07-01 DIAGNOSIS — Z833 Family history of diabetes mellitus: Secondary | ICD-10-CM | POA: Diagnosis not present

## 2017-07-01 DIAGNOSIS — M67813 Other specified disorders of tendon, right shoulder: Secondary | ICD-10-CM | POA: Insufficient documentation

## 2017-07-01 DIAGNOSIS — K219 Gastro-esophageal reflux disease without esophagitis: Secondary | ICD-10-CM | POA: Diagnosis not present

## 2017-07-01 DIAGNOSIS — R7303 Prediabetes: Secondary | ICD-10-CM | POA: Diagnosis not present

## 2017-07-01 DIAGNOSIS — Z800 Family history of malignant neoplasm of digestive organs: Secondary | ICD-10-CM | POA: Insufficient documentation

## 2017-07-01 DIAGNOSIS — Z79899 Other long term (current) drug therapy: Secondary | ICD-10-CM | POA: Insufficient documentation

## 2017-07-01 DIAGNOSIS — M75101 Unspecified rotator cuff tear or rupture of right shoulder, not specified as traumatic: Secondary | ICD-10-CM | POA: Diagnosis not present

## 2017-07-01 DIAGNOSIS — Z87891 Personal history of nicotine dependence: Secondary | ICD-10-CM | POA: Insufficient documentation

## 2017-07-01 DIAGNOSIS — Z885 Allergy status to narcotic agent status: Secondary | ICD-10-CM | POA: Insufficient documentation

## 2017-07-01 DIAGNOSIS — M7541 Impingement syndrome of right shoulder: Secondary | ICD-10-CM | POA: Insufficient documentation

## 2017-07-01 HISTORY — PX: SHOULDER ARTHROSCOPY WITH DEBRIDEMENT AND BICEP TENDON REPAIR: SHX5690

## 2017-07-01 HISTORY — DX: Unspecified osteoarthritis, unspecified site: M19.90

## 2017-07-01 HISTORY — DX: Prediabetes: R73.03

## 2017-07-01 LAB — GLUCOSE, CAPILLARY: Glucose-Capillary: 100 mg/dL — ABNORMAL HIGH (ref 65–99)

## 2017-07-01 SURGERY — SHOULDER ARTHROSCOPY WITH DEBRIDEMENT AND BICEP TENDON REPAIR
Anesthesia: General | Laterality: Right | Wound class: Clean

## 2017-07-01 MED ORDER — MIDAZOLAM HCL 2 MG/2ML IJ SOLN
INTRAMUSCULAR | Status: AC
  Administered 2017-07-01: 1 mg via INTRAVENOUS
  Filled 2017-07-01: qty 2

## 2017-07-01 MED ORDER — BUPIVACAINE-EPINEPHRINE (PF) 0.5% -1:200000 IJ SOLN
INTRAMUSCULAR | Status: AC
  Filled 2017-07-01: qty 30

## 2017-07-01 MED ORDER — MIDAZOLAM HCL 2 MG/2ML IJ SOLN
1.0000 mg | Freq: Once | INTRAMUSCULAR | Status: AC
  Administered 2017-07-01: 1 mg via INTRAVENOUS

## 2017-07-01 MED ORDER — DEXTROSE 5 % IV SOLN
INTRAVENOUS | Status: DC | PRN
  Administered 2017-07-01: 20 ug/min via INTRAVENOUS

## 2017-07-01 MED ORDER — FENTANYL CITRATE (PF) 100 MCG/2ML IJ SOLN
INTRAMUSCULAR | Status: AC
  Filled 2017-07-01: qty 2

## 2017-07-01 MED ORDER — BUPIVACAINE LIPOSOME 1.3 % IJ SUSP
INTRAMUSCULAR | Status: AC
  Filled 2017-07-01: qty 20

## 2017-07-01 MED ORDER — PROPOFOL 10 MG/ML IV BOLUS
INTRAVENOUS | Status: AC
  Filled 2017-07-01: qty 20

## 2017-07-01 MED ORDER — DEXAMETHASONE SODIUM PHOSPHATE 4 MG/ML IJ SOLN
INTRAMUSCULAR | Status: DC | PRN
  Administered 2017-07-01: 5 mg via INTRAVENOUS

## 2017-07-01 MED ORDER — SUGAMMADEX SODIUM 200 MG/2ML IV SOLN
INTRAVENOUS | Status: DC | PRN
  Administered 2017-07-01: 200 mg via INTRAVENOUS

## 2017-07-01 MED ORDER — ONDANSETRON HCL 4 MG/2ML IJ SOLN
INTRAMUSCULAR | Status: DC | PRN
  Administered 2017-07-01: 4 mg via INTRAVENOUS

## 2017-07-01 MED ORDER — DEXAMETHASONE SODIUM PHOSPHATE 10 MG/ML IJ SOLN
INTRAMUSCULAR | Status: AC
  Filled 2017-07-01: qty 1

## 2017-07-01 MED ORDER — LIDOCAINE HCL (PF) 1 % IJ SOLN
INTRAMUSCULAR | Status: AC
  Filled 2017-07-01: qty 5

## 2017-07-01 MED ORDER — ROCURONIUM BROMIDE 100 MG/10ML IV SOLN
INTRAVENOUS | Status: DC | PRN
  Administered 2017-07-01: 50 mg via INTRAVENOUS
  Administered 2017-07-01: 15 mg via INTRAVENOUS

## 2017-07-01 MED ORDER — ONDANSETRON HCL 4 MG/2ML IJ SOLN
INTRAMUSCULAR | Status: AC
  Filled 2017-07-01: qty 2

## 2017-07-01 MED ORDER — BUPIVACAINE HCL (PF) 0.5 % IJ SOLN
INTRAMUSCULAR | Status: DC | PRN
  Administered 2017-07-01: 10 mL via PERINEURAL

## 2017-07-01 MED ORDER — ONDANSETRON HCL 4 MG/2ML IJ SOLN
4.0000 mg | Freq: Once | INTRAMUSCULAR | Status: AC | PRN
  Administered 2017-07-01: 4 mg via INTRAVENOUS

## 2017-07-01 MED ORDER — LIDOCAINE HCL (PF) 2 % IJ SOLN
INTRAMUSCULAR | Status: AC
  Filled 2017-07-01: qty 10

## 2017-07-01 MED ORDER — FENTANYL CITRATE (PF) 250 MCG/5ML IJ SOLN
INTRAMUSCULAR | Status: AC
  Filled 2017-07-01: qty 5

## 2017-07-01 MED ORDER — SODIUM CHLORIDE 0.9 % IV SOLN
INTRAVENOUS | Status: DC
  Administered 2017-07-01: 12:00:00 via INTRAVENOUS

## 2017-07-01 MED ORDER — OXYCODONE HCL 5 MG PO TABS: 5.0000 mg | ORAL_TABLET | ORAL | 0 refills | Status: AC | PRN

## 2017-07-01 MED ORDER — ROCURONIUM BROMIDE 50 MG/5ML IV SOLN
INTRAVENOUS | Status: AC
  Filled 2017-07-01: qty 1

## 2017-07-01 MED ORDER — FENTANYL CITRATE (PF) 100 MCG/2ML IJ SOLN
INTRAMUSCULAR | Status: DC | PRN
  Administered 2017-07-01: 100 ug via INTRAVENOUS
  Administered 2017-07-01: 50 ug via INTRAVENOUS

## 2017-07-01 MED ORDER — FENTANYL CITRATE (PF) 100 MCG/2ML IJ SOLN
INTRAMUSCULAR | Status: AC
  Administered 2017-07-01: 50 ug via INTRAVENOUS
  Filled 2017-07-01: qty 2

## 2017-07-01 MED ORDER — LACTATED RINGERS IV SOLN
INTRAVENOUS | Status: DC | PRN
  Administered 2017-07-01: 2 mL

## 2017-07-01 MED ORDER — LIDOCAINE HCL (PF) 1 % IJ SOLN
INTRAMUSCULAR | Status: DC | PRN
  Administered 2017-07-01: 5 mL via SUBCUTANEOUS

## 2017-07-01 MED ORDER — CEFAZOLIN SODIUM-DEXTROSE 2-4 GM/100ML-% IV SOLN
INTRAVENOUS | Status: AC
  Filled 2017-07-01: qty 100

## 2017-07-01 MED ORDER — LIDOCAINE HCL (CARDIAC) 20 MG/ML IV SOLN
INTRAVENOUS | Status: DC | PRN
  Administered 2017-07-01: 100 mg via INTRAVENOUS

## 2017-07-01 MED ORDER — ONDANSETRON HCL 4 MG/2ML IJ SOLN
INTRAMUSCULAR | Status: AC
  Administered 2017-07-01: 4 mg via INTRAVENOUS
  Filled 2017-07-01: qty 2

## 2017-07-01 MED ORDER — PROPOFOL 10 MG/ML IV BOLUS
INTRAVENOUS | Status: DC | PRN
  Administered 2017-07-01: 150 mg via INTRAVENOUS
  Administered 2017-07-01: 50 mg via INTRAVENOUS

## 2017-07-01 MED ORDER — FENTANYL CITRATE (PF) 100 MCG/2ML IJ SOLN: 25.0000 ug | INTRAMUSCULAR | Status: DC | PRN

## 2017-07-01 MED ORDER — BUPIVACAINE-EPINEPHRINE 0.5% -1:200000 IJ SOLN
INTRAMUSCULAR | Status: DC | PRN
  Administered 2017-07-01: 30 mL

## 2017-07-01 MED ORDER — BUPIVACAINE HCL (PF) 0.5 % IJ SOLN
INTRAMUSCULAR | Status: AC
  Filled 2017-07-01: qty 30

## 2017-07-01 MED ORDER — BUPIVACAINE LIPOSOME 1.3 % IJ SUSP
INTRAMUSCULAR | Status: DC | PRN
  Administered 2017-07-01: 20 mL via PERINEURAL

## 2017-07-01 MED ORDER — FENTANYL CITRATE (PF) 100 MCG/2ML IJ SOLN
50.0000 ug | Freq: Once | INTRAMUSCULAR | Status: AC
  Administered 2017-07-01: 50 ug via INTRAVENOUS

## 2017-07-01 SURGICAL SUPPLY — 49 items
ANCH SUT 2 2.9 2 LD TPR NDL (Anchor) ×4 IMPLANT
ANCH SUT 2 2/0 ABS BRD STRL (Anchor) ×2 IMPLANT
ANCH SUT KNTLS STRL SHLDR SYS (Anchor) ×2 IMPLANT
ANCHOR JUGGERKNOT WTAP NDL 2.9 (Anchor) ×4 IMPLANT
ANCHOR SUT QUATTRO KNTLS 4.5 (Anchor) ×2 IMPLANT
ANCHOR SUT W/ ORTHOCORD (Anchor) ×2 IMPLANT
BIT DRILL JUGRKNT W/NDL BIT2.9 (DRILL) IMPLANT
BLADE FULL RADIUS 3.5 (BLADE) ×2 IMPLANT
BUR ACROMIONIZER 4.0 (BURR) ×2 IMPLANT
CANNULA SHAVER 8MMX76MM (CANNULA) ×2 IMPLANT
CHLORAPREP W/TINT 26ML (MISCELLANEOUS) ×2 IMPLANT
COVER MAYO STAND STRL (DRAPES) ×2 IMPLANT
DRAPE IMP U-DRAPE 54X76 (DRAPES) ×4 IMPLANT
DRILL JUGGERKNOT W/NDL BIT 2.9 (DRILL) ×2
DRSG OPSITE POSTOP 4X8 (GAUZE/BANDAGES/DRESSINGS) ×2 IMPLANT
ELECT REM PT RETURN 9FT ADLT (ELECTROSURGICAL) ×2
ELECTRODE REM PT RTRN 9FT ADLT (ELECTROSURGICAL) ×1 IMPLANT
GAUZE PETRO XEROFOAM 1X8 (MISCELLANEOUS) ×2 IMPLANT
GAUZE SPONGE 4X4 12PLY STRL (GAUZE/BANDAGES/DRESSINGS) ×2 IMPLANT
GLOVE BIO SURGEON STRL SZ7.5 (GLOVE) ×4 IMPLANT
GLOVE BIO SURGEON STRL SZ8 (GLOVE) ×4 IMPLANT
GLOVE BIOGEL PI IND STRL 8 (GLOVE) ×1 IMPLANT
GLOVE BIOGEL PI INDICATOR 8 (GLOVE) ×1
GLOVE INDICATOR 8.0 STRL GRN (GLOVE) ×2 IMPLANT
GOWN STRL REUS W/ TWL LRG LVL3 (GOWN DISPOSABLE) ×1 IMPLANT
GOWN STRL REUS W/ TWL XL LVL3 (GOWN DISPOSABLE) ×1 IMPLANT
GOWN STRL REUS W/TWL LRG LVL3 (GOWN DISPOSABLE) ×2
GOWN STRL REUS W/TWL XL LVL3 (GOWN DISPOSABLE) ×2
GRASPER SUT 15 45D LOW PRO (SUTURE) ×1 IMPLANT
IV LACTATED RINGER IRRG 3000ML (IV SOLUTION) ×4
IV LR IRRIG 3000ML ARTHROMATIC (IV SOLUTION) ×2 IMPLANT
MANIFOLD NEPTUNE II (INSTRUMENTS) ×2 IMPLANT
MASK FACE SPIDER DISP (MASK) ×2 IMPLANT
MAT BLUE FLOOR 46X72 FLO (MISCELLANEOUS) ×2 IMPLANT
NDL MAYO CATGUT SZ5 (NEEDLE)
NDL REVERSE CUT 1/2 CRC (NEEDLE) IMPLANT
NDL SUT 5 .5 CRC TPR PNT MAYO (NEEDLE) IMPLANT
NEEDLE REVERSE CUT 1/2 CRC (NEEDLE) IMPLANT
PACK ARTHROSCOPY SHOULDER (MISCELLANEOUS) ×2 IMPLANT
SLING ULTRA II LG (MISCELLANEOUS) ×2 IMPLANT
STAPLER SKIN PROX 35W (STAPLE) ×2 IMPLANT
STRAP SAFETY BODY (MISCELLANEOUS) ×2 IMPLANT
SUT ETHIBOND 0 MO6 C/R (SUTURE) ×2 IMPLANT
SUT VIC AB 2-0 CT1 27 (SUTURE) ×4
SUT VIC AB 2-0 CT1 TAPERPNT 27 (SUTURE) ×2 IMPLANT
TAPE MICROFOAM 4IN (TAPE) ×2 IMPLANT
TUBING ARTHRO INFLOW-ONLY STRL (TUBING) ×2 IMPLANT
TUBING CONNECTING 10 (TUBING) ×2 IMPLANT
WAND HAND CNTRL MULTIVAC 90 (MISCELLANEOUS) ×2 IMPLANT

## 2017-07-01 NOTE — H&P (Signed)
Paper H&P to be scanned into permanent record. H&P reviewed and patient re-examined. No changes. 

## 2017-07-01 NOTE — Transfer of Care (Signed)
Immediate Anesthesia Transfer of Care Note  Patient: Travis Hodges  Procedure(s) Performed: SHOULDER ARTHROSCOPY WITH DEBRIDEMENT, DECOMPRESSION, REPAIR OF LARGE ROTATOR CUFF TEAR AND POSSIBLE BICEP TENODESIS (Right )  Patient Location: PACU  Anesthesia Type:General  Level of Consciousness: awake and alert   Airway & Oxygen Therapy: Patient Spontanous Breathing and Patient connected to face mask oxygen  Post-op Assessment: Report given to RN and Post -op Vital signs reviewed and stable  Post vital signs: Reviewed and stable  Last Vitals:  Vitals:   07/01/17 1306 07/01/17 1524  BP: 131/74 (!) 152/78  Pulse: 69 65  Resp: 15 10  Temp:  (!) 36.4 C  SpO2: 97% 99%    Last Pain:  Vitals:   07/01/17 1139  TempSrc: Oral  PainSc: 7          Complications: No apparent anesthesia complications

## 2017-07-01 NOTE — OR Nursing (Signed)
Pt became nauseated after standing to dress. IV med was given.

## 2017-07-01 NOTE — Anesthesia Procedure Notes (Signed)
Procedure Name: Intubation Date/Time: 07/01/2017 1:19 PM Performed by: Bernardo Heater, CRNA Pre-anesthesia Checklist: Patient identified, Emergency Drugs available, Suction available and Patient being monitored Patient Re-evaluated:Patient Re-evaluated prior to induction Oxygen Delivery Method: Circle system utilized Preoxygenation: Pre-oxygenation with 100% oxygen Induction Type: IV induction Laryngoscope Size: Mac and 3 Grade View: Grade I Tube size: 7.0 mm Number of attempts: 1 Placement Confirmation: ETT inserted through vocal cords under direct vision,  positive ETCO2 and breath sounds checked- equal and bilateral Secured at: 23 cm Tube secured with: Tape Dental Injury: Teeth and Oropharynx as per pre-operative assessment

## 2017-07-01 NOTE — Op Note (Signed)
07/01/2017  3:12 PM  Patient:   Travis Hodges  Pre-Op Diagnosis:   Impingement/tendinopathy with large rotator cuff tear, right shoulder.  Post-Op Diagnosis: Impingement/tendinopathy with passive rotator cuff tear and biceps tendinopathy, right shoulder.  Procedure: Limited arthroscopic debridement, arthroscopic repair of subscapularis tendon tear, arthroscopic subacromial decompression, mini-open rotator cuff repair, and mini-open biceps tenodesis, right shoulder.  Anesthesia: General endotracheal with interscalene block placed preoperatively by the anesthesiologist.  Surgeon:   Maryagnes AmosJ. Jeffrey Poggi, MD  Assistant:   Horris LatinoLance McGhee, PA-C  Findings: As above. There was tears involving the insertional fibers of the superior portion of the subscapularis tendon, the entire supraspinatus tendon insertional fibers, and most of the infraspinatus tendon insertional fibers. The biceps tendon demonstrated significant tendinopathy changes. The labrum demonstrated some fraying anteriorly and superiorly, as well as a partial labral tear anteroinferiorly which was without instability. No significant degenerative changes were identified.  Complications: None  Fluids:   700 cc  Estimated blood loss: 10 cc  Tourniquet time: None  Drains: None  Closure: Staples   Brief clinical note: The patient is a 71 year old male with a history of progressively worsening right shoulder pain and weakness. The patient's symptoms have progressed despite medications, activity modification, etc. The patient's history and examination are consistent with impingement/tendinopathy with a large rotator cuff tear. These findings were confirmed by MRI scan. The patient presents at this time for definitive management of these shoulder symptoms.  Procedure: The patient underwent placement of an interscalene block by the anesthesiologist in the preoperative holding area before being brought into the  operating room and lain in the supine position. The patient then underwent general endotracheal intubation and anesthesia before being repositioned in the beach chair position using the beach chair positioner. The right shoulder and upper extremity were prepped with ChloraPrep solution before being draped sterilely. Preoperative antibiotics were administered. A timeout was performed to confirm the proper surgical site before the expected portal sites and incision site were injected with 0.5% Sensorcaine with epinephrine. A posterior portal was created and the glenohumeral joint thoroughly inspected with the findings as described above. An anterior portal was created using an outside-in technique. The labrum and rotator cuff were further probed, again confirming the above-noted findings. The areas of synovitis were debrided using the full-radius resector, as were the frayed margins of the labrum anteriorly and superiorly. The torn portion of the subscapularis tendon was also debrided using the full-radius resector. The ArthroCare wand was inserted and used to release the biceps tendon from its labral anchor, as well as to obtain hemostasis and to "anneal" the labrum superiorly and anteriorly.  The torn portion of the subscapularis tendon was repaired using a single Mitek BioKnotless anchor after creating a superolateral working portal using an outside in technique. The repair was stable to external rotation as well as with probing. The instruments were removed from the joint after suctioning the excess fluid.  The camera was repositioned through the posterior portal into the subacromial space. A separate lateral portal was created using an outside-in technique. The 3.5 mm full-radius resector was introduced and used to perform a subtotal bursectomy. The ArthroCare wand was then inserted and used to remove the periosteal tissue off the undersurface of the anterior third of the acromion as well as to recess the  coracoacromial ligament from its attachment along the anterior and lateral margins of the acromion. The 4.0 mm acromionizing bur was introduced and used to complete the decompression by removing the undersurface of the  anterior third of the acromion. The full radius resector was reintroduced to remove any residual bony debris before the ArthroCare wand was reintroduced to obtain hemostasis. The instruments were then removed from the subacromial space after suctioning the excess fluid.  An approximately 4-5 cm incision was made over the anterolateral aspect of the shoulder beginning at the anterolateral corner of the acromion and extending distally in line with the bicipital groove. This incision was carried down through the subcutaneous tissues to expose the deltoid fascia. The raphae between the anterior and middle thirds was identified and this plane developed to provide access into the subacromial space. Additional bursal tissues were debrided sharply using Metzenbaum scissors. The rotator cuff tear was readily identified. The margins were debrided sharply with a #15 blade and the exposed greater tuberosity roughened with a rongeur. The tear was repaired using three Biomet 2.9 mm JuggerKnot anchors. These sutures were then brought back laterally and secured using two [place] QuatroLink anchors to create a two-layer closure. An apparent watertight closure was obtained.  The bicipital groove was identified by palpation and opened for 1-1.5 cm. The biceps tendon stump was retrieved through this defect. The floor of the bicipital groove was roughened with a curet before another Biomet 2.9 mm JuggerKnot anchor was inserted. Both sets of sutures were passed through the biceps tendon and tied securely to effect the tenodesis. The bicipital sheath was reapproximated using two #0 Ethibond interrupted sutures, incorporating the biceps tendon to further reinforce the tenodesis.  The wound was copiously irrigated with  sterile saline solution before the deltoid raphae was reapproximated using 2-0 Vicryl interrupted sutures. The subcutaneous tissues were closed in two layers using 2-0 Vicryl interrupted sutures before the skin was closed using staples. The portal sites also were closed using staples. A sterile bulky dressing was applied to the shoulder before the arm was placed into a shoulder immobilizer. The patient was then awakened, extubated, and returned to the recovery room in satisfactory condition after tolerating the procedure well.

## 2017-07-01 NOTE — Discharge Instructions (Addendum)
Keep dressing dry and intact.  May shower after dressing changed on post-op day #4 (Saturday).  Cover staples with Band-Aids after drying off. Apply ice frequently to shoulder. Take ibuprofen 800 mg TID with meals for 7-10 days, then as necessary. Take oxycodone as prescribed when needed.  May supplement with ES Tylenol if necessary. Keep shoulder immobilizer on at all times except may remove for bathing purposes. Follow-up in 10-14 days or as scheduled.  AMBULATORY SURGERY  DISCHARGE INSTRUCTIONS   1) The drugs that you were given will stay in your system until tomorrow so for the next 24 hours you should not:  A) Drive an automobile B) Make any legal decisions C) Drink any alcoholic beverage   2) You may resume regular meals tomorrow.  Today it is better to start with liquids and gradually work up to solid foods.  You may eat anything you prefer, but it is better to start with liquids, then soup and crackers, and gradually work up to solid foods.   3) Please notify your doctor immediately if you have any unusual bleeding, trouble breathing, redness and pain at the surgery site, drainage, fever, or pain not relieved by medication.    4) Additional Instructions: TAKE A STOOL SOFTENER TWICE A DAY WHILE TAKING NARCOTIC PAIN MEDICINE TO PREVENT CONSTIPATION   Please contact your physician with any problems or Same Day Surgery at [phone], Monday through Friday 6 am to 4 pm, or [place] at [facility] Main number at [phone].

## 2017-07-01 NOTE — H&P (Signed)
Subjective:  Chief complaint:  Right Shoulder pain.  The patient is a 71 y.o. male who presents today for a right shoulder arthroscopy with debridement, decompression, repair of his large rotator cuff tear, and possible biceps tenodesis.  He denies any changes in his medical history since last being evaluated.  Pain has been ongoing for several months at this time.  No history of heart attack, blood clot, stroke or COPD.  There are no active problems to display for this patient.  Past Medical History:  Diagnosis Date  . Arthritis    RIGHT SHOULDER  . GERD (gastroesophageal reflux disease)   . Pre-diabetes     Past Surgical History:  Procedure Laterality Date  . JOINT REPLACEMENT Bilateral LEFT-2011 AND RIGHT 2014   HIPS    Medications Prior to Admission  Medication Sig Dispense Refill Last Dose  . ibuprofen (ADVIL,MOTRIN) 200 MG tablet Take 400 mg every 8 (eight) hours as needed by mouth for mild pain.   06/26/2017  . lansoprazole (PREVACID) 30 MG capsule Take 30 mg by mouth as needed.   06/30/2017 at Unknown time  . traMADol (ULTRAM) 50 MG tablet Take 50 mg every 12 (twelve) hours as needed by mouth for moderate pain.   06/30/2017 at Unknown time  . traZODone (DESYREL) 100 MG tablet TAKE ONE TABLET BY MOUTH IN THE EVENING   06/30/2017 at Unknown time   Allergies  Allergen Reactions  . Demerol [Meperidine] Swelling    Social History   Tobacco Use  . Smoking status: Former Smoker    Packs/day: 0.50    Years: 2.00    Pack years: 1.00    Types: Cigarettes    Last attempt to quit: 06/26/1987    Years since quitting: 30.0  . Smokeless tobacco: Never Used  Substance Use Topics  . Alcohol use: Yes    Comment: RARE    Family History  Problem Relation Age of Onset  . Stomach cancer Mother   . Diabetes Father      Review of Systems: As noted above. The patient denies any chest pain, shortness of breath, nausea, vomiting, diarrhea, constipation, belly pain, blood in his/her  stool, or burning with urination.  Objective: Temp:  [97.7 F (36.5 C)] 97.7 F (36.5 C) (12/04 1139) Pulse Rate:  [71-77] 74 (12/04 1246) Resp:  [11-18] 11 (12/04 1246) BP: (98-167)/(65-87) 140/81 (12/04 1246) SpO2:  [98 %-100 %] 98 % (12/04 1246) Weight:  [97.1 kg (214 lb)] 97.1 kg (214 lb) (12/04 1139)  Physical Exam: General:  Alert, no acute distress Psychiatric:  Patient is competent for consent with normal mood and affect Cardiovascular:  RRR  Respiratory:  Clear to auscultation. No wheezing. Non-labored breathing GI:  Abdomen is soft and non-tender Skin:  No lesions in the area of chief complaint Neurologic:  Sensation intact distally Lymphatic:  No axillary or cervical lymphadenopathy  Right shoulder exam: SKIN: normal SWELLING: none WARMTH: none LYMPH NODES: no adenopathy palpable CREPITUS: none TENDERNESS: Mildly tender over the lateral acromial region ROM (active):      Forward flexion: 165    Abduction: 160    Internal rotation: T12 ROM (passive):      Forward flexion: 170    Abduction: 165    ER/IR at 90 abd: 90/70  He has mild pain with forward flexion and abduction.  STRENGTH:   Forward flexion: 4-4+/5  Abduction: 4+/5                         External rotation: 4-4+/5                         Internal rotation: 4+/5                         Pain with RC testing: Mild pain with resisted forward flexion more so than with resisted external rotation  STABILITY: Normal  SPECIAL TESTS:       Juanetta GoslingHawkins' test: positive, mild                                     Speed's test: Mildly positive                                     Capsulitis - pain w/ passive ER: no                                     Crossed arm test: Negative                                     Crank: Not evaluated                                     Anterior apprehension: Negative                                     Posterior apprehension: Not  evaluated Imaging: Based on the report, the patient has a full-thickness tear of the rotator cuff involving both the anterior portion of the supraspinatus as well as the superior portion of the subscapularis tendons.  No significant degenerative changes are identified.  Assessment: Right rotator cuff tear.  Plan: 1.  Treatment options were discussed with the patient. 2.  Pt was instructed of the risks and benefits of the procedure including but not limited to bleeding, infection, nerve and/or blood vessel injury, persistent or recurrent pain,  failure of the repair, dislocation, need for further surgery, blood clots, strokes, heart attacks or arrhythmias, pneumonia, etc.) and wishes to proceed. 3.  Pt will undergo a right shoulder arthroscopy with debridement, decompression, open repair of his large rotator cuff tear and possible biceps tenodesis. 4.  This document will serve as the surgical H&P, he will follow-up per standard post-op protocol.  Valeria BatmanJ. Lance Jakalyn Kratky, PA-C [facility]

## 2017-07-01 NOTE — Anesthesia Post-op Follow-up Note (Signed)
Anesthesia QCDR form completed.        

## 2017-07-01 NOTE — Anesthesia Preprocedure Evaluation (Signed)
Anesthesia Evaluation  Patient identified by MRN, date of birth, ID band Patient awake    Reviewed: Allergy & Precautions, H&P , NPO status , Patient's Chart, lab work & pertinent test results, reviewed documented beta blocker date and time   History of Anesthesia Complications Negative for: history of anesthetic complications  Airway Mallampati: III  TM Distance: >3 FB Neck ROM: full    Dental  (+) Poor Dentition, Caps, Dental Advidsory Given, Missing   Pulmonary neg pulmonary ROS, former smoker,           Cardiovascular Exercise Tolerance: Good negative cardio ROS       Neuro/Psych negative neurological ROS  negative psych ROS   GI/Hepatic Neg liver ROS, GERD  ,  Endo/Other  diabetes (Pre-diabetic), Well Controlled  Renal/GU negative Renal ROS  negative genitourinary   Musculoskeletal   Abdominal   Peds  Hematology negative hematology ROS (+)   Anesthesia Other Findings Past Medical History: No date: Arthritis     Comment:  RIGHT SHOULDER No date: GERD (gastroesophageal reflux disease) No date: Pre-diabetes   Reproductive/Obstetrics negative OB ROS                             Anesthesia Physical Anesthesia Plan  ASA: II  Anesthesia Plan: General   Post-op Pain Management:  Regional for Post-op pain   Induction: Intravenous  PONV Risk Score and Plan:   Airway Management Planned: Oral ETT  Additional Equipment:   Intra-op Plan:   Post-operative Plan: Extubation in OR  Informed Consent: I have reviewed the patients History and Physical, chart, labs and discussed the procedure including the risks, benefits and alternatives for the proposed anesthesia with the patient or authorized representative who has indicated his/her understanding and acceptance.   Dental Advisory Given  Plan Discussed with: Anesthesiologist, CRNA and Surgeon  Anesthesia Plan Comments:          Anesthesia Quick Evaluation

## 2017-07-01 NOTE — Anesthesia Procedure Notes (Signed)
Anesthesia Regional Block: Interscalene brachial plexus block   Pre-Anesthetic Checklist: ,, timeout performed, Correct Patient, Correct Site, Correct Laterality, Correct Procedure, Correct Position, site marked, Risks and benefits discussed,  Surgical consent,  Pre-op evaluation,  At surgeon's request and post-op pain management  Laterality: Right and Upper  Prep: chloraprep       Needles:  Injection technique: Single-shot  Needle Type: Stimiplex     Needle Length: 5cm  Needle Gauge: 22     Additional Needles:   Procedures:,,,, ultrasound used (permanent image in chart),,,,  Narrative:  Start time: 07/01/2017 12:45 PM End time: 07/01/2017 12:50 PM Injection made incrementally with aspirations every 5 mL.  Performed by: Personally  Anesthesiologist: Lenard SimmerKarenz, Jp Eastham, MD  Additional Notes: Functioning IV was confirmed and monitors were applied.  A 50mm 22ga Stimuplex needle was used. Sterile prep and drape,hand hygiene and sterile gloves were used.  Negative aspiration and negative test dose prior to incremental administration of local anesthetic. The patient tolerated the procedure well.

## 2017-07-02 ENCOUNTER — Encounter: Payer: Self-pay | Admitting: Surgery

## 2017-07-02 NOTE — Anesthesia Postprocedure Evaluation (Signed)
Anesthesia Post Note  Patient: Travis Hodges  Procedure(s) Performed: SHOULDER ARTHROSCOPY WITH DEBRIDEMENT, DECOMPRESSION, REPAIR OF LARGE ROTATOR CUFF TEAR AND POSSIBLE BICEP TENODESIS (Right )  Patient location during evaluation: PACU Anesthesia Type: General Level of consciousness: awake and alert Pain management: pain level controlled Vital Signs Assessment: post-procedure vital signs reviewed and stable Respiratory status: spontaneous breathing, nonlabored ventilation, respiratory function stable and patient connected to nasal cannula oxygen Cardiovascular status: blood pressure returned to baseline and stable Postop Assessment: no apparent nausea or vomiting Anesthetic complications: no     Last Vitals:  Vitals:   07/01/17 1645 07/01/17 1700  BP: (!) 144/80 (!) 145/84  Pulse: 61 60  Resp:    Temp:    SpO2: 95% 95%    Last Pain:  Vitals:   07/02/17 0904  TempSrc:   PainSc: 1                  Lenard SimmerAndrew Shalayah Beagley

## 2017-07-10 ENCOUNTER — Other Ambulatory Visit: Payer: Self-pay

## 2017-07-10 ENCOUNTER — Emergency Department: Payer: Medicare Other

## 2017-07-10 ENCOUNTER — Emergency Department
Admission: EM | Admit: 2017-07-10 | Discharge: 2017-07-10 | Disposition: A | Discharge status: Home / Self Care | Payer: Medicare Other | Attending: Emergency Medicine | Admitting: Emergency Medicine

## 2017-07-10 DIAGNOSIS — R42 Dizziness and giddiness: Secondary | ICD-10-CM | POA: Diagnosis not present

## 2017-07-10 DIAGNOSIS — M7989 Other specified soft tissue disorders: Secondary | ICD-10-CM | POA: Diagnosis not present

## 2017-07-10 DIAGNOSIS — Z96611 Presence of right artificial shoulder joint: Secondary | ICD-10-CM | POA: Diagnosis not present

## 2017-07-10 DIAGNOSIS — M79601 Pain in right arm: Secondary | ICD-10-CM

## 2017-07-10 DIAGNOSIS — Z96612 Presence of left artificial shoulder joint: Secondary | ICD-10-CM | POA: Diagnosis not present

## 2017-07-10 DIAGNOSIS — Z87891 Personal history of nicotine dependence: Secondary | ICD-10-CM | POA: Insufficient documentation

## 2017-07-10 DIAGNOSIS — Z79899 Other long term (current) drug therapy: Secondary | ICD-10-CM | POA: Insufficient documentation

## 2017-07-10 LAB — CBC
HEMATOCRIT: 42.3 % (ref 40.0–52.0)
HEMOGLOBIN: 15.1 g/dL (ref 13.0–18.0)
MCH: 31.9 pg (ref 26.0–34.0)
MCHC: 35.6 g/dL (ref 32.0–36.0)
MCV: 89.6 fL (ref 80.0–100.0)
Platelets: 294 10*3/uL (ref 150–440)
RBC: 4.73 MIL/uL (ref 4.40–5.90)
RDW: 12.6 % (ref 11.5–14.5)
WBC: 9.8 10*3/uL (ref 3.8–10.6)

## 2017-07-10 LAB — BASIC METABOLIC PANEL
ANION GAP: 9 (ref 5–15)
BUN: 18 mg/dL (ref 6–20)
CO2: 25 mmol/L (ref 22–32)
Calcium: 9.1 mg/dL (ref 8.9–10.3)
Chloride: 99 mmol/L — ABNORMAL LOW (ref 101–111)
Creatinine, Ser: 0.82 mg/dL (ref 0.61–1.24)
GFR calc Af Amer: >60 mL/min (ref >60)
GLUCOSE: 151 mg/dL — AB (ref 65–99)
POTASSIUM: 4.3 mmol/L (ref 3.5–5.1)
Sodium: 133 mmol/L — ABNORMAL LOW (ref 135–145)

## 2017-07-10 NOTE — ED Notes (Signed)
First nurse note  [facility]   States he developed some blurred vision and dizziness yesterday  Had recent shoulder surgery about 2 weeks ago

## 2017-07-10 NOTE — Discharge Instructions (Signed)
Your workup today was negative for a blood clot.  Your blood work is normal.  Please drink plenty of fluids and follow-up with your doctor as scheduled.  Return to the emergency department for any worsening symptoms or any other symptom personally concerning to yourself.

## 2017-07-10 NOTE — ED Triage Notes (Signed)
Pt states he had rotator cuff repair tuesday 12/4 and for the past 2 days has had HA with dizziness and blurred vision/ states he saw the orthopedist today and had the staples removed but is concerned about a blood blot in that right arm , states they had noticed a knot on the anterior upper arm yesterday ..Travis Hodges

## 2017-07-10 NOTE — ED Provider Notes (Signed)
[facility] Emergency Department Provider Note  Time seen: 6:22 PM  I have reviewed the triage vital signs and the nursing notes.   HISTORY  Chief Complaint Dizziness and Arm Pain    HPI Travis Hodges is a 71 y.o. male with a past medical history of arthritis, gastric reflux, recent right shoulder surgery performed 9 days ago who presents to the emergency department for dizziness and right arm swelling/discomfort.  According to the patient for the past 2-3 days he has been experiencing intermittent dizziness which she describes as more of a lightheaded sensation.  States intermittent mild headache.  Denies any known fever.  Denies any chest pain or shortness of breath.  Patient was seen for a follow-up appointment today with orthopedics for staple removal found to have right arm discomfort with some mild swelling and was sent to the emergency department to rule out DVT.  Past Medical History:  Diagnosis Date  . Arthritis    RIGHT SHOULDER  . GERD (gastroesophageal reflux disease)   . Pre-diabetes     There are no active problems to display for this patient.   Past Surgical History:  Procedure Laterality Date  . JOINT REPLACEMENT Bilateral LEFT-2011 AND RIGHT 2014   HIPS  . SHOULDER ARTHROSCOPY WITH DEBRIDEMENT AND BICEP TENDON REPAIR Right 07/01/2017   Procedure: SHOULDER ARTHROSCOPY WITH DEBRIDEMENT, DECOMPRESSION, REPAIR OF LARGE ROTATOR CUFF TEAR AND POSSIBLE BICEP TENODESIS;  Surgeon: Christena FlakePoggi, John J, MD;  Location: [facility] ORS;  Service: Orthopedics;  Laterality: Right;    Prior to Admission medications   Medication Sig Start Date End Date Taking? Authorizing Provider  ibuprofen (ADVIL,MOTRIN) 200 MG tablet Take 400 mg every 8 (eight) hours as needed by mouth for mild pain.    [provider]  lansoprazole (PREVACID) 30 MG capsule Take 30 mg by mouth as needed.    [provider]  oxyCODONE (ROXICODONE) 5 MG immediate release tablet  Take 1-2 tablets (5-10 mg total) by mouth every 4 (four) hours as needed. 07/01/17   Poggi, Excell SeltzerJohn J, MD  traZODone (DESYREL) 100 MG tablet TAKE ONE TABLET BY MOUTH IN THE EVENING 03/08/16   [provider]    Allergies  Allergen Reactions  . Demerol [Meperidine] Swelling    Family History  Problem Relation Age of Onset  . Stomach cancer Mother   . Diabetes Father     Social History Social History   Tobacco Use  . Smoking status: Former Smoker    Packs/day: 0.50    Years: 2.00    Pack years: 1.00    Types: Cigarettes    Last attempt to quit: 06/26/1987    Years since quitting: 30.0  . Smokeless tobacco: Never Used  Substance Use Topics  . Alcohol use: Yes    Comment: RARE  . Drug use: No    Review of Systems Constitutional: Negative for fever.  Positive for intermittent dizziness/lightheadedness. Cardiovascular: Negative for chest pain. Respiratory: Negative for shortness of breath. Gastrointestinal: Negative for abdominal pain Musculoskeletal: Moderate right arm discomfort Skin: Negative for rash. Neurological: Positive for intermittent headaches.  No right arm numbness or weakness. All other ROS negative  ____________________________________________   PHYSICAL EXAM:  VITAL SIGNS: ED Triage Vitals  Enc Vitals Group     BP 07/10/17 1421 115/83     Pulse Rate 07/10/17 1421 95     Resp 07/10/17 1421 18     Temp 07/10/17 1421 97.6 F (36.4 C)     Temp Source  07/10/17 1421 Oral     SpO2 07/10/17 1421 96 %     Weight 07/10/17 1421 214 lb (97.1 kg)     Height 07/10/17 1421 6' (1.829 m)     Head Circumference --      Peak Flow --      Pain Score 07/10/17 1420 5     Pain Loc --      Pain Edu? --      Excl. in GC? --     Constitutional: Alert and oriented. Well appearing and in no distress. Eyes: Normal exam ENT   Head: Normocephalic and atraumatic.   Mouth/Throat: Mucous membranes are moist. Cardiovascular: Normal rate, regular rhythm.   Respiratory: Normal respiratory effort without tachypnea nor retractions. Breath sounds are clear  Gastrointestinal: Soft and nontender. No distention.  Musculoskeletal: Nontender with normal range of motion in all extremities.  Neurovascularly intact distally, patient does have tenderness surrounding the proximal right upper extremity and shoulder which would be expected following a surgical operation.  2+ radial pulse, sensation intact with good grip strength. Neurologic:  Normal speech and language. No gross focal neurologic deficits  Skin:  Skin is warm, dry and intact.  Psychiatric: Mood and affect are normal.  ____________________________________________    EKG  EKG reviewed and interpreted by myself shows normal sinus rhythm at 88 bpm with a narrow QRS, normal axis, normal intervals, no concerning ST changes.  Reassuring EKG.  ____________________________________________    RADIOLOGY  Ultrasound negative for DVT  ____________________________________________   INITIAL IMPRESSION / ASSESSMENT AND PLAN / ED COURSE  Pertinent labs & imaging results that were available during my care of the patient were reviewed by me and considered in my medical decision making (see chart for details).  Patient presents to the emergency department for intermittent dizziness as well as right arm discomfort to rule out DVT.  Differential would include DVT, dehydration, medication reaction, left light abnormality, metabolic abnormality.  Overall the patient appears extremely well, denies any dizziness at this time.  Patient states over the past 2-3 days he has been taking his pain medication during the day which he typically did not do, but noted the dizziness is deftly worse after taking pain medication, this could be a medication reaction.  Patient's labs overall appear well, no significant dehydration on blood work.  Patient denies any dysuria cloudier dark urine and does not wish to wait for  urinalysis in the emergency department.  Ultrasound is negative for DVT.  No chest pain or shortness of breath, do not suspect PE, normal vitals.  Given the patient's largely normal workup I believe the patient is safe for discharge home.  I reviewed the telephone notes regarding this swelling between orthopedics and the patient.  There is no note from today's orthopedic visit currently present for review. ____________________________________________   FINAL CLINICAL IMPRESSION(S) / ED DIAGNOSES  Right arm pain/swelling Postoperative discomfort/swelling Dizziness     Minna AntisPaduchowski, Shaquoia Miers, MD 07/10/17 [phone]

## 2017-07-17 ENCOUNTER — Ambulatory Visit: Payer: Self-pay | Admitting: *Deleted

## 2018-06-15 IMAGING — US US EXTREM  UP VENOUS*R*
1 series · 13 of 24 positions shown · non-contrast
Comparison: None.

CLINICAL DATA: 68-year-old male with a history of pain and swelling
right upper extremity



[Series 1: us extrem up venous*right* · 0.07mm/px · 13 of 34 slices shown]
[im 1/34]
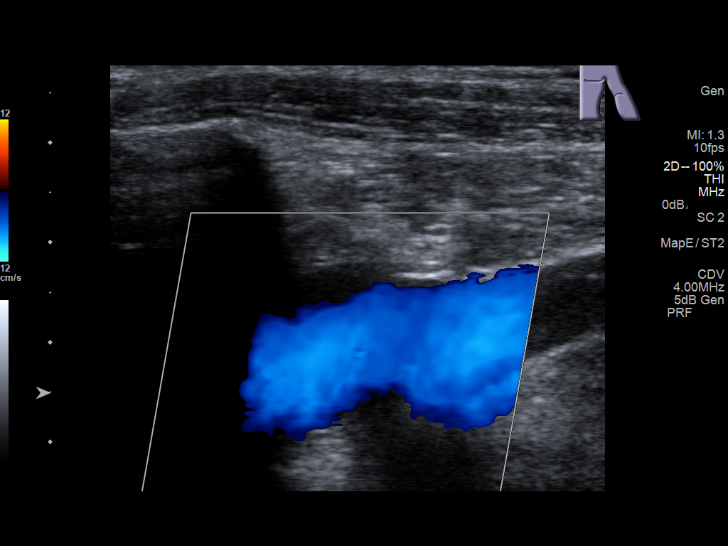
[im 3/34]
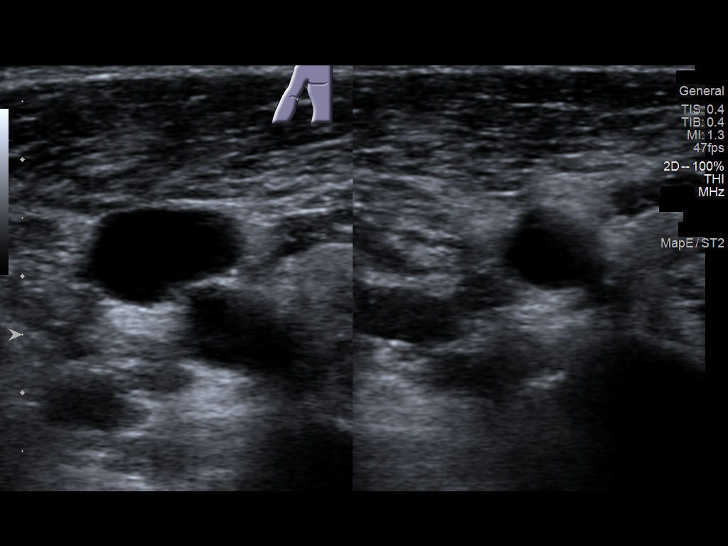
[im 6/34]
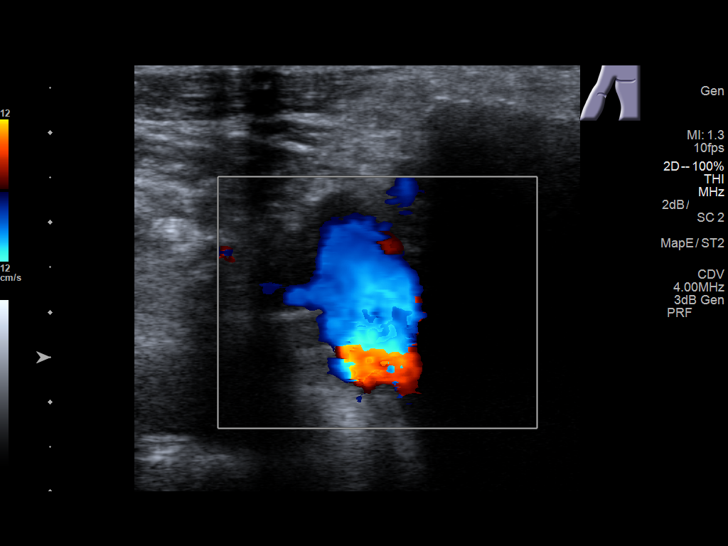
[im 9/34]
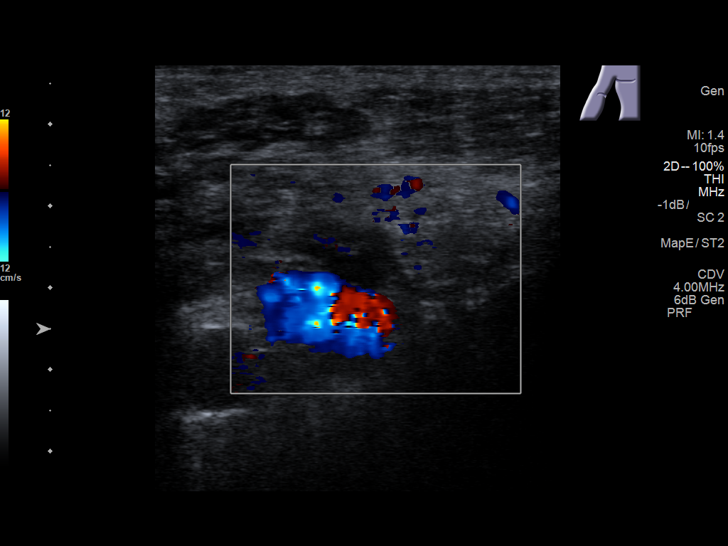
[im 12/34]
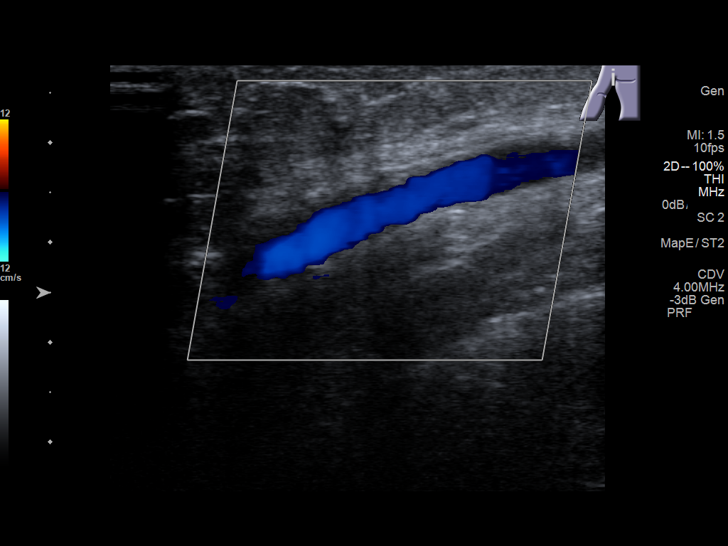
[im 15/34]
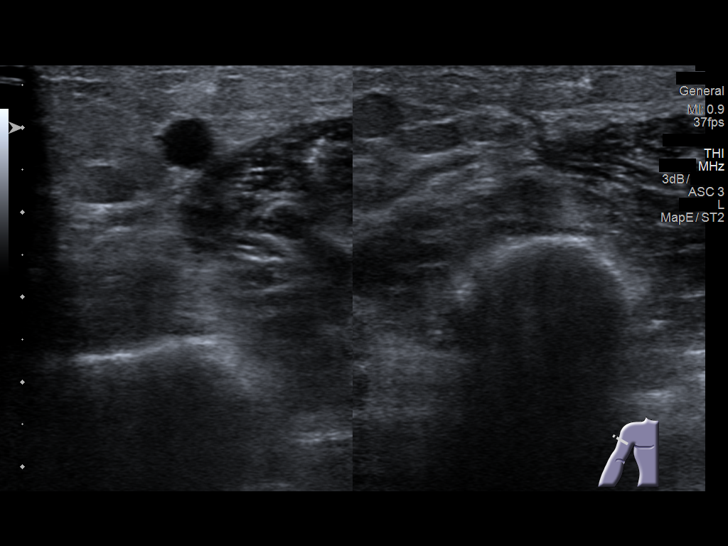
[im 18/34]
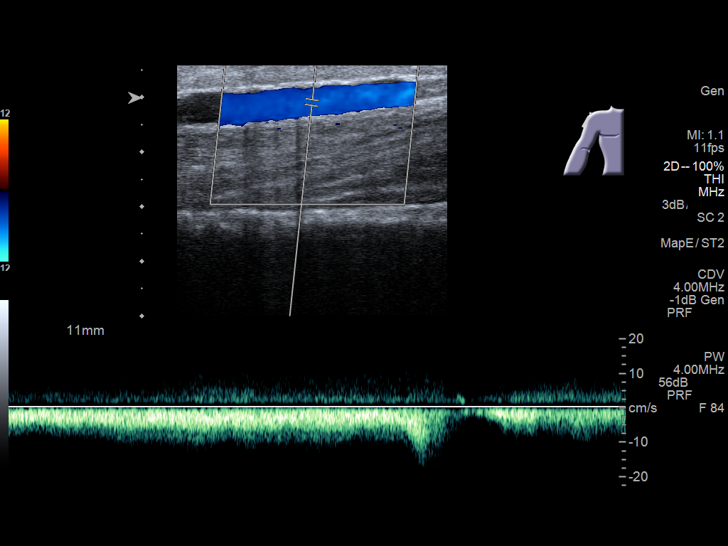
[im 19/34]
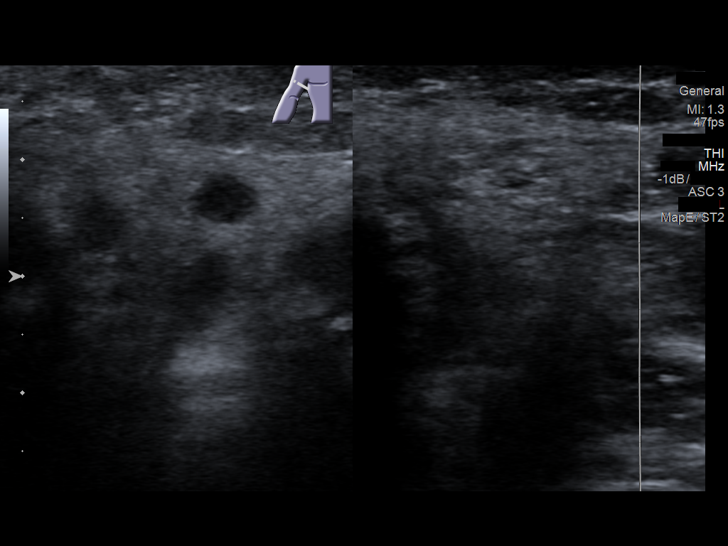
[im 22/34]
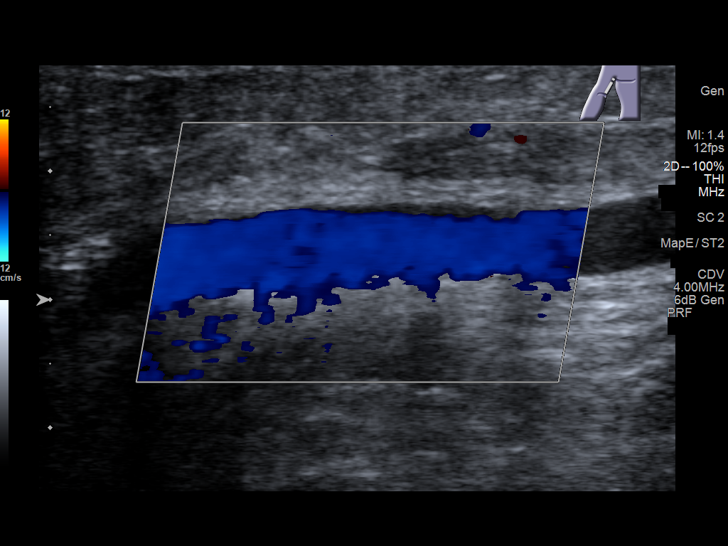
[im 25/34]
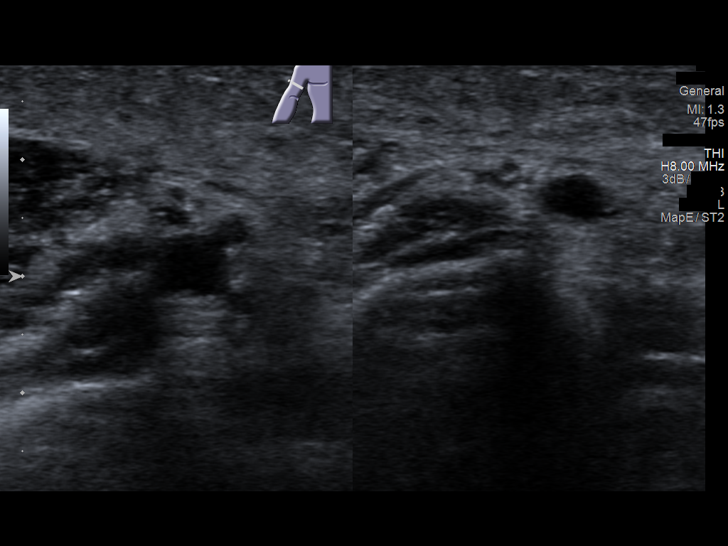
[im 28/34]
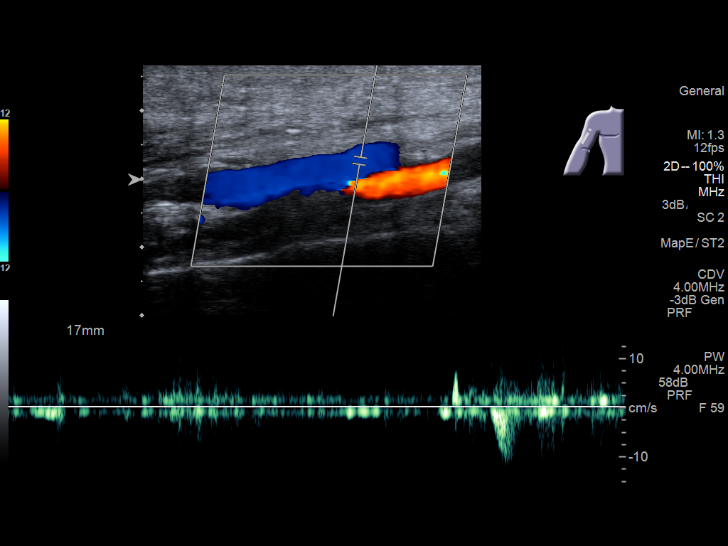
[im 31/34]
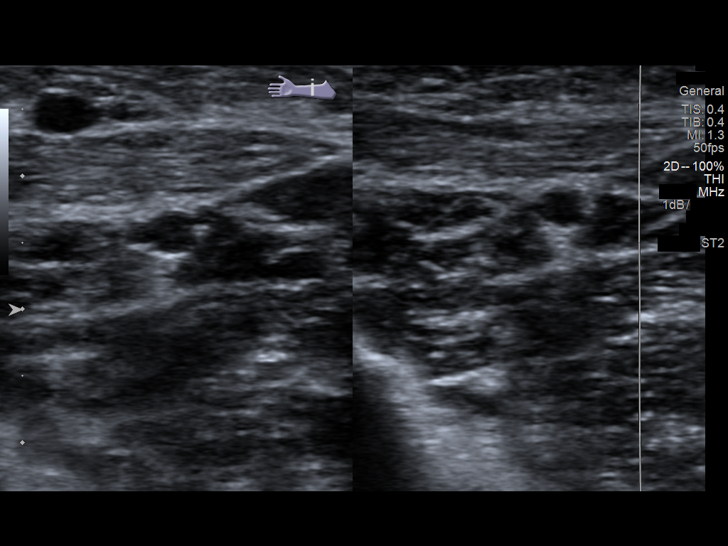
[im 34/34]
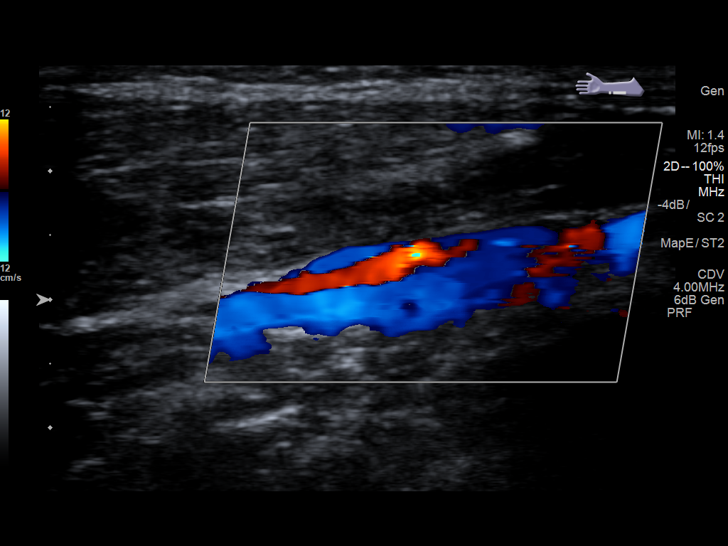

[13 of 24 positions shown; findings below may reference images not displayed]

FINDINGS: Contralateral Subclavian Vein: Respiratory phasicity is normal and
symmetric with the symptomatic side. No evidence of thrombus. Normal
compressibility.

Internal Jugular Vein: No evidence of thrombus. Normal
compressibility, respiratory phasicity and response to augmentation.

Subclavian Vein: No evidence of thrombus. Normal compressibility,
respiratory phasicity and response to augmentation.

Axillary Vein: No evidence of thrombus. Normal compressibility,
respiratory phasicity and response to augmentation.

Cephalic Vein: No evidence of thrombus. Normal compressibility,
respiratory phasicity and response to augmentation.

Basilic Vein: No evidence of thrombus. Normal compressibility,
respiratory phasicity and response to augmentation.

Brachial Veins: No evidence of thrombus. Normal compressibility,
respiratory phasicity and response to augmentation.

Radial Veins: No evidence of thrombus. Normal compressibility,
respiratory phasicity and response to augmentation.

Ulnar Veins: No evidence of thrombus. Normal compressibility,
respiratory phasicity and response to augmentation.

Other Findings:  None visualized.
IMPRESSION: Sonographic survey of the right upper extremity negative for DVT

## 2018-08-06 IMAGING — MR MR SHOULDER*R* W/O CM
5 series · 40 of 40 positions shown · non-contrast
Comparison: None.

CLINICAL DATA: Right shoulder pain and decreased range of motion.
Symptoms for 6-8 weeks. No known injury.

EXAM:
MRI OF THE RIGHT SHOULDER WITHOUT CONTRAST
TECHNIQUE: Multiplanar, multisequence MR imaging of the shoulder was performed.
No intravenous contrast was administered.

[Series 3: T2 fat-sat · axial · 4.0mm · 0.59mm/px · z∈[-38,+68]mm · 8 of 25 slices shown (1 of 2)]
[im 1/25]
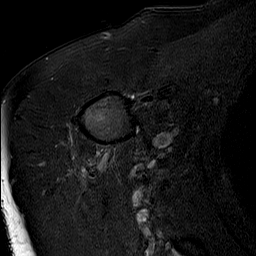
[im 4/25]
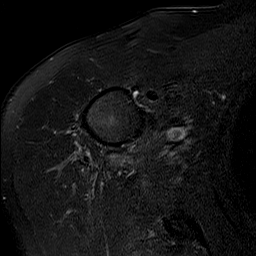
[im 7/25]
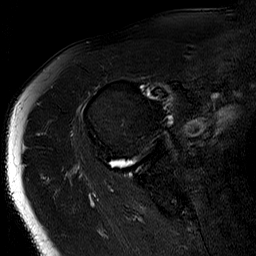
[im 11/25]
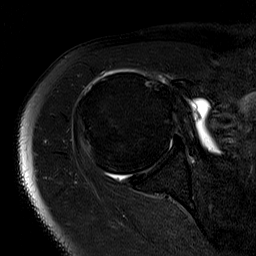
[im 14/25]
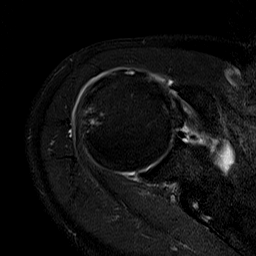
[im 18/25]
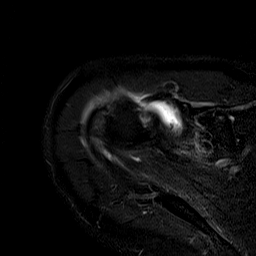
[im 21/25]
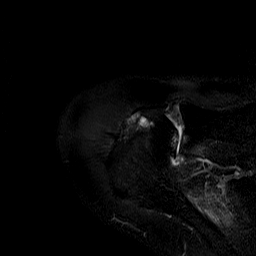
[im 25/25]
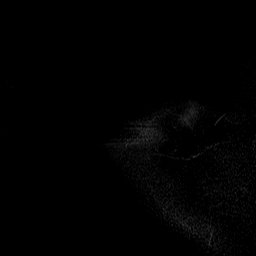

[Series 4: T2 fat-sat · oblique · 4.0mm · 0.59mm/px · 8 of 23 slices shown (2 of 2)]
[im 1/23]
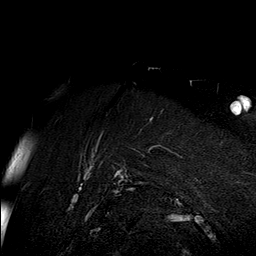
[im 4/23]
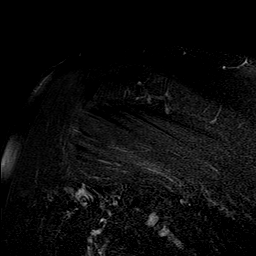
[im 7/23]
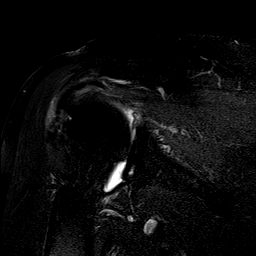
[im 10/23]
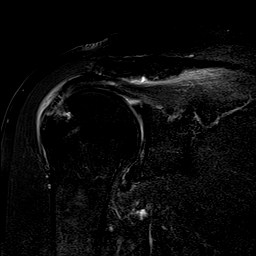
[im 13/23]
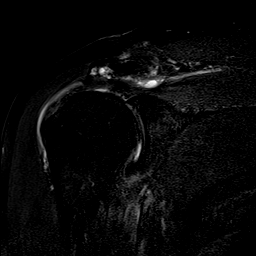
[im 16/23]
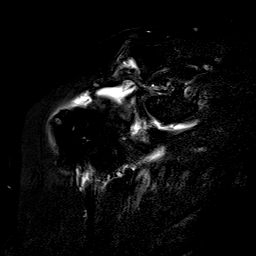
[im 19/23]
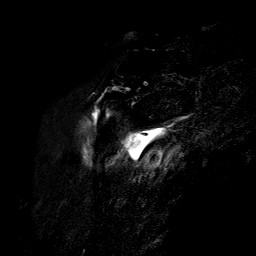
[im 23/23]
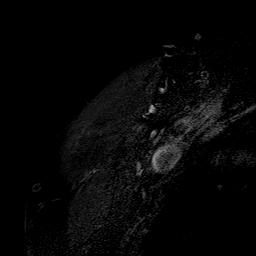

[Series 5: PD · oblique · 4.0mm · 0.59mm/px · 8 of 23 slices shown]
[im 1/23]
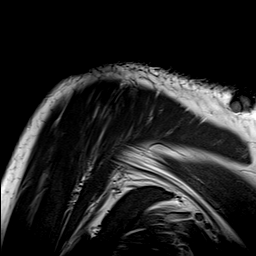
[im 4/23]
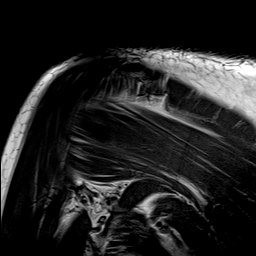
[im 7/23]
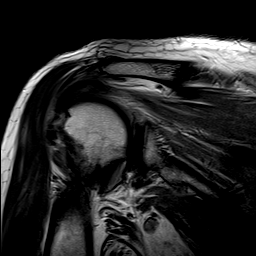
[im 10/23]
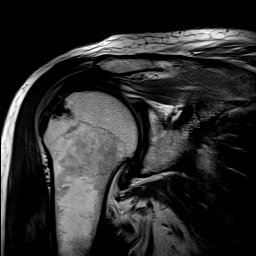
[im 13/23]
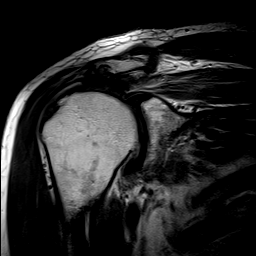
[im 16/23]
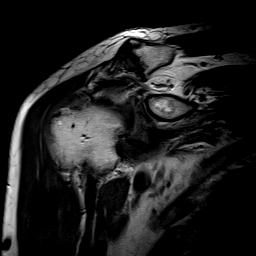
[im 19/23]
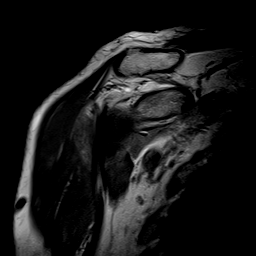
[im 23/23]
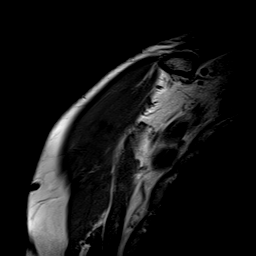

[Series 6: T1 · oblique · 4.0mm · 0.59mm/px · 8 of 23 slices shown]
[im 1/23]
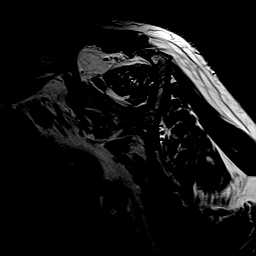
[im 4/23]
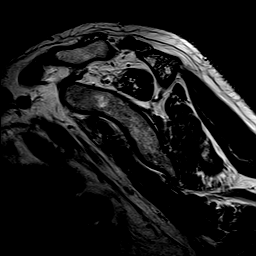
[im 7/23]
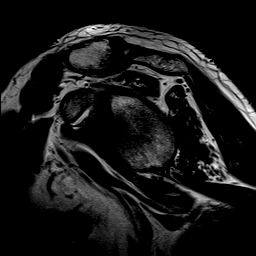
[im 10/23]
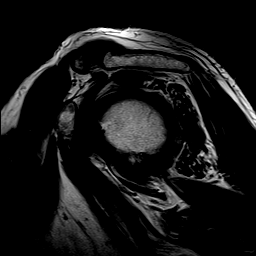
[im 13/23]
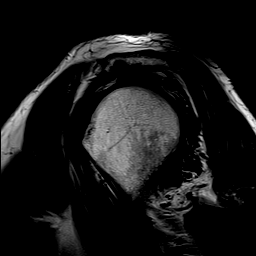
[im 16/23]
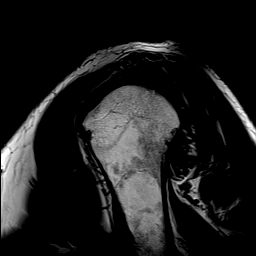
[im 19/23]
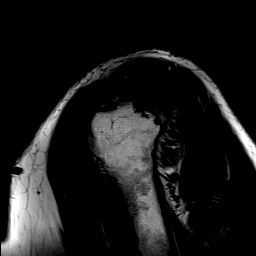
[im 23/23]
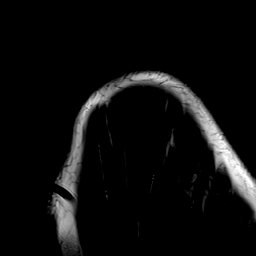

[Series 100: T2 · oblique · 4.0mm · 0.59mm/px · 8 of 23 slices shown]
[im 1/23]
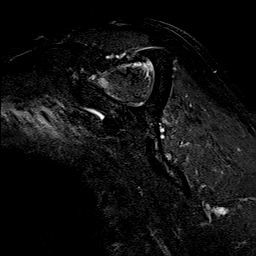
[im 4/23]
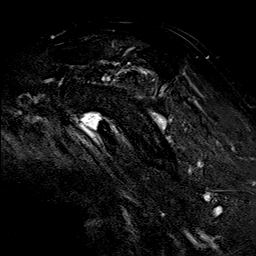
[im 7/23]
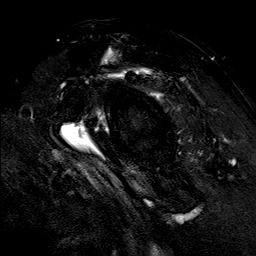
[im 10/23]
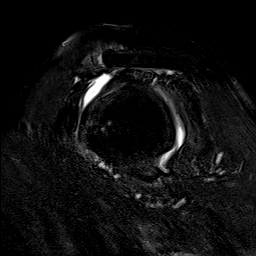
[im 13/23]
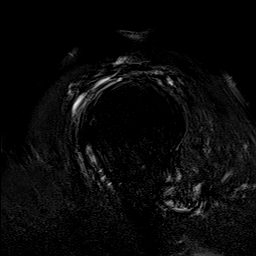
[im 16/23]
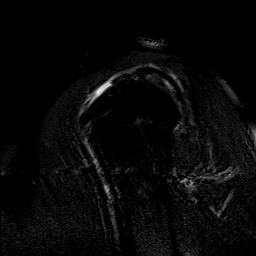
[im 19/23]
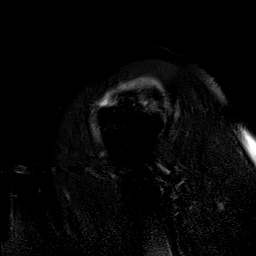
[im 23/23]
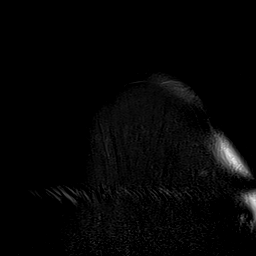

[40 of 40 positions shown; findings below may reference images not displayed]

FINDINGS: Rotator cuff: The patient has a large full-thickness tear of the
leading edge of the supraspinatus measuring approximately 1.2 cm
from front to back at the greater tuberosity. Tendon retraction is
to the top of the humeral head, 2.5-3 cm. There is also a partial
width, full-thickness tear of the superior most fibers of the
subscapularis with approximately 2.5 cm of retraction. The tear
involves approximately the superior 1 cm of the tendon at the lesser
tuberosity.

Muscles:  No focal atrophy or lesion.

Biceps long head: Severe tendinopathy of the intra-articular segment
is identified with only a thin strand of tendon appearing intact.
Intrasubstance increased T2 signal is seen within the tendon and the
bicipital groove.

Acromioclavicular Joint: Bulky degenerative change is present. Type
1 acromion. There is subacromial spurring. Prominent fluid is seen
in the subacromial/subdeltoid bursa.

Glenohumeral Joint: Mild degenerative change noted.

Labrum:  Intact.

Bones:  No fracture or worrisome lesion.

Other: None.
IMPRESSION: Rotator cuff tendinopathy with a large tear of the leading edge of
the supraspinatus showing 2.5-3 cm of retraction. Partial width,
full-thickness tear of the superior subscapularis involves
approximately 1 cm of the craniocaudal dimension of the tendon.
There is retraction of approximately 2.5 cm. Negative for atrophy.

Severe tendinopathy of the intra-articular and extra-articular long
head of biceps tendon. Only a thin strand of the intra-articular
segment appears intact.

Bulky acromioclavicular osteoarthritis. Subacromial spurring also
noted.

Prominent subacromial/subdeltoid fluid consistent with bursitis.

## 2020-06-04 ENCOUNTER — Other Ambulatory Visit: Payer: Self-pay

## 2020-06-04 ENCOUNTER — Emergency Department
Admission: EM | Admit: 2020-06-04 | Discharge: 2020-06-04 | Disposition: A | Discharge status: Home / Self Care | Payer: Medicare HMO | Attending: Emergency Medicine | Admitting: Emergency Medicine

## 2020-06-04 ENCOUNTER — Emergency Department: Payer: Medicare HMO

## 2020-06-04 DIAGNOSIS — Y990 Civilian activity done for income or pay: Secondary | ICD-10-CM | POA: Insufficient documentation

## 2020-06-04 DIAGNOSIS — M546 Pain in thoracic spine: Secondary | ICD-10-CM | POA: Diagnosis not present

## 2020-06-04 DIAGNOSIS — Z87891 Personal history of nicotine dependence: Secondary | ICD-10-CM | POA: Insufficient documentation

## 2020-06-04 DIAGNOSIS — S161XXA Strain of muscle, fascia and tendon at neck level, initial encounter: Secondary | ICD-10-CM | POA: Diagnosis not present

## 2020-06-04 DIAGNOSIS — Z96643 Presence of artificial hip joint, bilateral: Secondary | ICD-10-CM | POA: Diagnosis not present

## 2020-06-04 DIAGNOSIS — S0990XA Unspecified injury of head, initial encounter: Secondary | ICD-10-CM

## 2020-06-04 DIAGNOSIS — S199XXA Unspecified injury of neck, initial encounter: Secondary | ICD-10-CM | POA: Diagnosis present

## 2020-06-04 MED ORDER — CYCLOBENZAPRINE HCL 5 MG PO TABS: 5.0000 mg | ORAL_TABLET | Freq: Three times a day (TID) | ORAL | 0 refills | Status: AC | PRN

## 2020-06-04 MED ORDER — ACETAMINOPHEN 500 MG PO TABS
1000.0000 mg | ORAL_TABLET | Freq: Once | ORAL | Status: AC
  Administered 2020-06-04: 1000 mg via ORAL
  Filled 2020-06-04: qty 2
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
## 2020-06-04 NOTE — ED Notes (Signed)
E sign for discharge on paper 

## 2022-06-25 ENCOUNTER — Other Ambulatory Visit: Payer: Self-pay | Admitting: Family Medicine

## 2022-06-25 DIAGNOSIS — M5416 Radiculopathy, lumbar region: Secondary | ICD-10-CM

## 2022-07-15 ENCOUNTER — Other Ambulatory Visit: Payer: Medicare HMO

## 2022-07-17 ENCOUNTER — Encounter: Payer: Self-pay | Admitting: Family Medicine

## 2022-07-19 ENCOUNTER — Ambulatory Visit
Admission: RE | Admit: 2022-07-19 | Discharge: 2022-07-19 | Disposition: A | Discharge status: Home / Self Care | Payer: Medicare HMO | Source: Ambulatory Visit | Attending: Family Medicine | Admitting: Family Medicine

## 2022-07-19 DIAGNOSIS — M5416 Radiculopathy, lumbar region: Secondary | ICD-10-CM

## 2023-04-30 ENCOUNTER — Ambulatory Visit
Admission: RE | Admit: 2023-04-30 | Discharge: 2023-04-30 | Disposition: A | Discharge status: Home / Self Care | Payer: Medicare HMO | Source: Ambulatory Visit | Attending: Family Medicine | Admitting: Family Medicine

## 2023-04-30 ENCOUNTER — Other Ambulatory Visit: Payer: Self-pay | Admitting: Family Medicine

## 2023-04-30 DIAGNOSIS — M7989 Other specified soft tissue disorders: Secondary | ICD-10-CM | POA: Diagnosis present

## 2023-04-30 DIAGNOSIS — M25462 Effusion, left knee: Secondary | ICD-10-CM

## 2023-04-30 DIAGNOSIS — M25562 Pain in left knee: Secondary | ICD-10-CM | POA: Insufficient documentation

## 2024-02-27 ENCOUNTER — Ambulatory Visit: Payer: Self-pay

## 2024-02-27 DIAGNOSIS — K640 First degree hemorrhoids: Secondary | ICD-10-CM | POA: Diagnosis not present

## 2024-02-27 DIAGNOSIS — Z1211 Encounter for screening for malignant neoplasm of colon: Secondary | ICD-10-CM | POA: Diagnosis present

## 2024-02-27 DIAGNOSIS — K5730 Diverticulosis of large intestine without perforation or abscess without bleeding: Secondary | ICD-10-CM | POA: Diagnosis not present
